# Patient Record
Sex: Female | Born: 1995 | Race: White | Hispanic: No | Marital: Single | Smoking: Former smoker
Health system: Southern US, Community
[De-identification: ages and names within clinical notes are randomized; demographics above are authoritative.]

## PROBLEM LIST (undated history)

## (undated) ENCOUNTER — Inpatient Hospital Stay (HOSPITAL_COMMUNITY): Payer: Self-pay

## (undated) DIAGNOSIS — A770 Spotted fever due to Rickettsia rickettsii: Secondary | ICD-10-CM

## (undated) DIAGNOSIS — N12 Tubulo-interstitial nephritis, not specified as acute or chronic: Secondary | ICD-10-CM

## (undated) DIAGNOSIS — J45909 Unspecified asthma, uncomplicated: Secondary | ICD-10-CM

## (undated) HISTORY — DX: Spotted fever due to Rickettsia rickettsii: A77.0

## (undated) HISTORY — PX: TYMPANOSTOMY TUBE PLACEMENT: SHX32

---
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
## 2014-01-16 ENCOUNTER — Encounter: Payer: Self-pay | Admitting: Obstetrics & Gynecology

## 2014-11-05 ENCOUNTER — Inpatient Hospital Stay (HOSPITAL_COMMUNITY)
Admission: AD | Admit: 2014-11-05 | Discharge: 2014-11-06 | Disposition: A | Discharge status: Home / Self Care | Payer: Medicaid Other | Source: Ambulatory Visit | Attending: Obstetrics and Gynecology | Admitting: Obstetrics and Gynecology

## 2014-11-05 DIAGNOSIS — Z87891 Personal history of nicotine dependence: Secondary | ICD-10-CM | POA: Insufficient documentation

## 2014-11-05 DIAGNOSIS — R109 Unspecified abdominal pain: Secondary | ICD-10-CM | POA: Insufficient documentation

## 2014-11-05 DIAGNOSIS — J45909 Unspecified asthma, uncomplicated: Secondary | ICD-10-CM | POA: Diagnosis not present

## 2014-11-05 DIAGNOSIS — O26899 Other specified pregnancy related conditions, unspecified trimester: Secondary | ICD-10-CM

## 2014-11-05 DIAGNOSIS — O26891 Other specified pregnancy related conditions, first trimester: Secondary | ICD-10-CM | POA: Diagnosis not present

## 2014-11-05 DIAGNOSIS — Z3A08 8 weeks gestation of pregnancy: Secondary | ICD-10-CM | POA: Diagnosis not present

## 2014-11-05 DIAGNOSIS — R1030 Lower abdominal pain, unspecified: Secondary | ICD-10-CM | POA: Diagnosis present

## 2014-11-05 DIAGNOSIS — O9989 Other specified diseases and conditions complicating pregnancy, childbirth and the puerperium: Secondary | ICD-10-CM | POA: Diagnosis not present

## 2014-11-05 LAB — POCT PREGNANCY, URINE: PREG TEST UR: POSITIVE — AB

## 2014-11-05 NOTE — MAU Note (Signed)
Having some lower left abdominal cramping that started tonight. Having SOB when doing every day activities, but is not SOB now. "feels like an asthma attack"  Rates 8/10. Denies vag bleeding but having a lot of discharge, but does not itch or have an odor. Had mirena placed, but "it fell out" 5-6 months ago. LMP:?

## 2014-11-06 ENCOUNTER — Inpatient Hospital Stay (HOSPITAL_COMMUNITY): Payer: Medicaid Other

## 2014-11-06 ENCOUNTER — Encounter (HOSPITAL_COMMUNITY): Payer: Self-pay

## 2014-11-06 DIAGNOSIS — R109 Unspecified abdominal pain: Secondary | ICD-10-CM | POA: Diagnosis not present

## 2014-11-06 DIAGNOSIS — O9989 Other specified diseases and conditions complicating pregnancy, childbirth and the puerperium: Secondary | ICD-10-CM

## 2014-11-06 LAB — GC/CHLAMYDIA PROBE AMP (~~LOC~~) NOT AT ARMC
CHLAMYDIA, DNA PROBE: NEGATIVE
NEISSERIA GONORRHEA: NEGATIVE

## 2014-11-06 LAB — WET PREP, GENITAL
TRICH WET PREP: NONE SEEN
YEAST WET PREP: NONE SEEN

## 2014-11-06 LAB — CBC
HEMATOCRIT: 32.6 % — AB (ref 36.0–46.0)
HEMOGLOBIN: 11.3 g/dL — AB (ref 12.0–15.0)
MCH: 29.9 pg (ref 26.0–34.0)
MCHC: 34.7 g/dL (ref 30.0–36.0)
MCV: 86.2 fL (ref 78.0–100.0)
Platelets: 238 10*3/uL (ref 150–400)
RBC: 3.78 MIL/uL — AB (ref 3.87–5.11)
RDW: 12.1 % (ref 11.5–15.5)
WBC: 5.9 10*3/uL (ref 4.0–10.5)

## 2014-11-06 LAB — URINALYSIS, ROUTINE W REFLEX MICROSCOPIC
Bilirubin Urine: NEGATIVE
Glucose, UA: NEGATIVE mg/dL
Hgb urine dipstick: NEGATIVE
Ketones, ur: NEGATIVE mg/dL
Leukocytes, UA: NEGATIVE
NITRITE: NEGATIVE
PH: 7 (ref 5.0–8.0)
Protein, ur: NEGATIVE mg/dL
SPECIFIC GRAVITY, URINE: 1.02 (ref 1.005–1.030)
UROBILINOGEN UA: 1 mg/dL (ref 0.0–1.0)

## 2014-11-06 LAB — ABO/RH: ABO/RH(D): AB POS

## 2014-11-06 LAB — HCG, QUANTITATIVE, PREGNANCY: HCG, BETA CHAIN, QUANT, S: 177833 m[IU]/mL — AB (ref <5)

## 2014-11-06 LAB — HIV ANTIBODY (ROUTINE TESTING W REFLEX): HIV Screen 4th Generation wRfx: NONREACTIVE

## 2014-11-06 NOTE — MAU Provider Note (Signed)
History     CSN: [id number]  Arrival date and time: 11/05/14 2321   First Provider Initiated Contact with Patient 11/05/14 2358      No chief complaint on file.  This is a 19 y.o. female at Unknown GA by unsure LMP who presents with + pregnancy test and lower abdominal pain which has worsened today.  Denies bleeding. States Mirena IUD fell out about 6 months ago and was not using other contraception. Has not had periods since it came out. Was seen by Dr Jolayne Panther in 2014 thinking it fell out, but it was in place then. This time, she actually had it in her hand  Had a vaginal delivery 04/30/12 at 38.6 weeks.  Normal delivery  With no complications except poor prenatal care compliance  Abdominal Pain This is a new problem. The current episode started today. The problem occurs constantly. The problem has been gradually worsening. The pain is located in the LLQ. The pain is at a severity of 8/10. The pain is moderate. The quality of the pain is aching and cramping. The abdominal pain does not radiate. Pertinent negatives include no anorexia, constipation, diarrhea, dysuria, fever, frequency, myalgias, nausea or vomiting. The pain is aggravated by palpation. The pain is relieved by nothing. She has tried nothing for the symptoms.   RN Note: Having some lower left abdominal cramping that started tonight. Having SOB when doing every day activities, but is not SOB now. "feels like an asthma attack" Rates 8/10. Denies vag bleeding but having a lot of discharge, but does not itch or have an odor. Had mirena placed, but "it fell out" 5-6 months ago. LMP:?           OB History    Gravida Para Term Preterm AB TAB SAB Ectopic Multiple Living   3 1 1  1  1   1       Past Medical History  Diagnosis Date  . RMSF [facility] spotted fever)     hospitalization  . Asthma   . Pyelonephritis     Past Surgical History  Procedure Laterality Date  . Tympanostomy tube placement      Family  History  Problem Relation Age of Onset  . Cancer Mother   . Hepatitis B Mother   . Diabetes Mother   . Cancer Maternal Grandmother     Social History  Substance Use Topics  . Smoking status: Former Games developer  . Smokeless tobacco: Former Neurosurgeon    Quit date: 03/30/2012  . Alcohol Use: No    Allergies:  Allergies  Allergen Reactions  . Fexofenadine Hcl     Headaches   . 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 Latex Rash    Prescriptions prior to admission  Medication Sig Dispense Refill Last Dose  . Prenatal Vit-Fe Fumarate-FA (PRENATAL MULTIVITAMIN) TABS Take 1 tablet by mouth daily.   Past Week at Unknown time  . CHLOROPHYLL PO Take 15 mLs by mouth 2 (two) times a week.   Not Taking  . ibuprofen (ADVIL,MOTRIN) 600 MG tablet Take 1 tablet (600 mg total) by mouth every 6 (six) hours. 50 tablet 1 More than a month at Unknown time  . ranitidine (ZANTAC) 150 MG tablet Take 150 mg by mouth 2 (two) times daily.   Taking   Medical, Surgical, Family and Social histories reviewed and are listed above.  Medications and allergies reviewed.  I reviewed her notes from her 2014 medical exams.  Review of Systems  Constitutional: Negative for fever, chills and malaise/fatigue.  Respiratory: Positive for shortness of breath (Had some this week but none now, states it is just feeling out of breath). Negative for cough, hemoptysis, sputum production and wheezing (No respiratory complaints at this time).   Gastrointestinal: Positive for abdominal pain. Negative for nausea, vomiting, diarrhea, constipation and anorexia.  Genitourinary: Negative for dysuria and frequency.  Musculoskeletal: Negative for myalgias and back pain.  Neurological: Negative for dizziness, focal weakness and weakness.  Other systems negative  Physical Exam   Blood pressure 110/67, pulse 86, temperature 98.2 F (36.8 C), temperature source Oral, resp. rate 18, height 5\' 2"  (1.575 m), weight 133 lb 9.6 oz (60.601  kg), SpO2 100 %, currently breastfeeding.  Physical Exam  Constitutional: She is oriented to person, place, and time. She appears well-developed and well-nourished. No distress.  HENT:  Head: Normocephalic.  Cardiovascular: Normal rate and regular rhythm.   Respiratory: Effort normal. No respiratory distress.  GI: Soft.  Genitourinary: Vagina normal. No vaginal discharge found.  Cervix closed and long, no abnormal discharge Cultures obtained Wet prep obtained Uterus is tender, about 6-8 wk size Is tender in bilateral adnexae  Musculoskeletal: Normal range of motion. She exhibits no edema or tenderness.  Neurological: She is alert and oriented to person, place, and time.  Skin: Skin is warm and dry.  Psychiatric: She has a normal mood and affect.    MAU Course  Procedures  MDM This pain could represent a normal pregnancy with cramping, spontaneous abortion or even an ectopic which can be life-threatening.  The tenderness she exhibited on exam is worriesome for ectopic pregnancy. Cultures were done to rule out pelvic infection Blood drawn for Quant HCG, CBC, ABO/Rh  Results for orders placed or performed during the hospital encounter of 11/05/14 (from the past 24 hour(s))  Urinalysis, Routine w reflex microscopic (not at [facility])     Status: None   Collection Time: 11/05/14 11:38 PM  Result Value Ref Range   Color, Urine YELLOW YELLOW   APPearance CLEAR CLEAR   Specific Gravity, Urine 1.020 1.005 - 1.030   pH 7.0 5.0 - 8.0   Glucose, UA NEGATIVE NEGATIVE mg/dL   Hgb urine dipstick NEGATIVE NEGATIVE   Bilirubin Urine NEGATIVE NEGATIVE   Ketones, ur NEGATIVE NEGATIVE mg/dL   Protein, ur NEGATIVE NEGATIVE mg/dL   Urobilinogen, UA 1.0 0.0 - 1.0 mg/dL   Nitrite NEGATIVE NEGATIVE   Leukocytes, UA NEGATIVE NEGATIVE  Pregnancy, urine POC     Status: Abnormal   Collection Time: 11/05/14 11:46 PM  Result Value Ref Range   Preg Test, Ur POSITIVE (A) NEGATIVE  hCG, quantitative,  pregnancy     Status: Abnormal   Collection Time: 11/05/14 11:59 PM  Result Value Ref Range   hCG, Beta Chain, Sharene Butters, S 161096 (H) <5 mIU/mL  CBC     Status: Abnormal   Collection Time: 11/05/14 11:59 PM  Result Value Ref Range   WBC 5.9 4.0 - 10.5 K/uL   RBC 3.78 (L) 3.87 - 5.11 MIL/uL   Hemoglobin 11.3 (L) 12.0 - 15.0 g/dL   HCT 04.5 (L) 40.9 - 81.1 %   MCV 86.2 78.0 - 100.0 fL   MCH 29.9 26.0 - 34.0 pg   MCHC 34.7 30.0 - 36.0 g/dL   RDW 91.4 78.2 - 95.6 %   Platelets 238 150 - 400 K/uL  ABO/Rh     Status: None   Collection Time: 11/05/14 11:59 PM  Result Value Ref Range   ABO/RH(D) AB POS   Wet prep, genital     Status: Abnormal   Collection Time: 11/06/14 12:16 AM  Result Value Ref Range   Yeast Wet Prep HPF POC NONE SEEN NONE SEEN   Trich, Wet Prep NONE SEEN NONE SEEN   Clue Cells Wet Prep HPF POC FEW (A) NONE SEEN   WBC, Wet Prep HPF POC MANY (A) NONE SEEN   US Ob Comp Less 14 Wks  11/06/2014   CLINICAL DATA:  Pregnant patient with pelvic pain and bilateral adnexal tenderness. Cramping for 1 day.  EXAM: OBSTETRIC <14 WK [place] AND TRANSVAGINAL OB US  TECHNIQUE: Both transabdominal and transvaginal ultrasound examinations were performed for complete evaluation of the gestation as well as the maternal uterus, adnexal regions, and pelvic cul-de-sac. Transvaginal technique was performed to assess early pregnancy.  COMPARISON:  None.  FINDINGS: Intrauterine gestational sac: Visualized/normal in shape.  Yolk sac:  Present.  Embryo:  Present.  Cardiac Activity: Present.  Heart Rate: 158  bpm  CRL:  18  mm   8 w   2 d                  [place] EDC: 06/16/2015  Maternal uterus/adnexae: Small subchorionic hemorrhage fundal and questionable lower. The left ovary measures 2.7 x 1.3 x 2.3 cm and is normal. The right ovary measures 3.7 x 2.6 x 2.6 cm with a probable corpus luteal cyst. There is blood flow to both ovaries. There is no pelvic free fluid.  IMPRESSION: Single live intrauterine pregnancy  estimated gestational age [redacted] weeks 2 days for estimated date of delivery 06/16/2015. Small subchorionic hemorrhage.   Electronically Signed   By: Rubye Oaks M.D.   On: 11/06/2014 01:15   US Ob Transvaginal  11/06/2014   CLINICAL DATA:  Pregnant patient with pelvic pain and bilateral adnexal tenderness. Cramping for 1 day.  EXAM: OBSTETRIC <14 WK [place] AND TRANSVAGINAL OB US  TECHNIQUE: Both transabdominal and transvaginal ultrasound examinations were performed for complete evaluation of the gestation as well as the maternal uterus, adnexal regions, and pelvic cul-de-sac. Transvaginal technique was performed to assess early pregnancy.  COMPARISON:  None.  FINDINGS: Intrauterine gestational sac: Visualized/normal in shape.  Yolk sac:  Present.  Embryo:  Present.  Cardiac Activity: Present.  Heart Rate: 158  bpm  CRL:  18  mm   8 w   2 d                  [place] EDC: 06/16/2015  Maternal uterus/adnexae: Small subchorionic hemorrhage fundal and questionable lower. The left ovary measures 2.7 x 1.3 x 2.3 cm and is normal. The right ovary measures 3.7 x 2.6 x 2.6 cm with a probable corpus luteal cyst. There is blood flow to both ovaries. There is no pelvic free fluid.  IMPRESSION: Single live intrauterine pregnancy estimated gestational age [redacted] weeks 2 days for estimated date of delivery 06/16/2015. Small subchorionic hemorrhage.   Electronically Signed   By: Rubye Oaks M.D.   On: 11/06/2014 01:15    Assessment and Plan  A:  Single IUP at [redacted]w[redacted]d by [place]       No evidence of ectopic pregnancy       Small subchorionic hemorrhage with no vaginal bleeding  P:  Discussed findings and results with patient       Pictures given       Reassured       Encouraged early  prenatal care for OB exams, list of providers given       Come back if pain increases or bleeding develops. Discussed she might see some spotting due to [facility] 11/06/2014, 12:18 AM

## 2014-11-06 NOTE — Discharge Instructions (Signed)
Abdominal Pain During Pregnancy °Abdominal pain is common in pregnancy. Most of the time, it does not cause harm. There are many causes of abdominal pain. Some causes are more serious than others. Some of the causes of abdominal pain in pregnancy are easily diagnosed. Occasionally, the diagnosis takes time to understand. Other times, the cause is not determined. Abdominal pain can be a sign that something is very wrong with the pregnancy, or the pain may have nothing to do with the pregnancy at all. For this reason, always tell your health care provider if you have any abdominal discomfort. °HOME CARE INSTRUCTIONS  °Monitor your abdominal pain for any changes. The following actions may help to alleviate any discomfort you are experiencing: °· Do not have sexual intercourse or put anything in your vagina until your symptoms go away completely. °· Get plenty of rest until your pain improves. °· Drink clear fluids if you feel nauseous. Avoid solid food as long as you are uncomfortable or nauseous. °· Only take over-the-counter or prescription medicine as directed by your health care provider. °· Keep all follow-up appointments with your health care provider. °SEEK IMMEDIATE MEDICAL CARE IF: °· You are bleeding, leaking fluid, or passing tissue from the vagina. °· You have increasing pain or cramping. °· You have persistent vomiting. °· You have painful or bloody urination. °· You have a fever. °· You notice a decrease in your baby's movements. °· You have extreme weakness or feel faint. °· You have shortness of breath, with or without abdominal pain. °· You develop a severe headache with abdominal pain. °· You have abnormal vaginal discharge with abdominal pain. °· You have persistent diarrhea. °· You have abdominal pain that continues even after rest, or gets worse. °MAKE SURE YOU:  °· Understand these instructions. °· Will watch your condition. °· Will get help right away if you are not doing well or get  worse. °Document Released: 03/03/2005 Document Revised: 12/22/2012 Document Reviewed: 09/30/2012 °ExitCare® Patient Information ©2015 [employer]. This information is not intended to replace advice given to you by your health care provider. Make sure you discuss any questions you have with your health care provider. ° °Prenatal Care Providers °[facility]    [facility] ° Phone- [phone]     Phone: [phone] °         °[facility]                      [facility] ° @[place]     Phone: [phone] ° Phone: [phone] °        [facility] °[facility]     Phone: [phone] ° Phone: [phone]   °        [facility] °[facility] [place]                hone: [phone] ° Phone: [phone] °        [facility] °Dr. Bernard Marshall      Phone: [phone] ° Phone: [phone] °        [facility] °[facility]                Phone: [phone] ° Women’s Health  ° Phone:[phone]    [facility]) °         Phone: [phone] °[facility] °  Phone: [phone] ° °

## 2015-05-22 ENCOUNTER — Encounter (HOSPITAL_COMMUNITY): Payer: Self-pay | Admitting: *Deleted

## 2015-05-22 ENCOUNTER — Inpatient Hospital Stay (HOSPITAL_COMMUNITY): Payer: Medicaid Other

## 2015-05-22 ENCOUNTER — Inpatient Hospital Stay (HOSPITAL_COMMUNITY)
Admission: AD | Admit: 2015-05-22 | Discharge: 2015-05-22 | Disposition: A | Discharge status: Home / Self Care | Payer: Medicaid Other | Source: Ambulatory Visit | Attending: Family Medicine | Admitting: Family Medicine

## 2015-05-22 DIAGNOSIS — J45909 Unspecified asthma, uncomplicated: Secondary | ICD-10-CM | POA: Insufficient documentation

## 2015-05-22 DIAGNOSIS — D649 Anemia, unspecified: Secondary | ICD-10-CM | POA: Diagnosis not present

## 2015-05-22 DIAGNOSIS — O9989 Other specified diseases and conditions complicating pregnancy, childbirth and the puerperium: Secondary | ICD-10-CM | POA: Diagnosis not present

## 2015-05-22 DIAGNOSIS — R109 Unspecified abdominal pain: Secondary | ICD-10-CM | POA: Insufficient documentation

## 2015-05-22 DIAGNOSIS — O479 False labor, unspecified: Secondary | ICD-10-CM | POA: Diagnosis present

## 2015-05-22 DIAGNOSIS — Z3A36 36 weeks gestation of pregnancy: Secondary | ICD-10-CM | POA: Insufficient documentation

## 2015-05-22 DIAGNOSIS — R042 Hemoptysis: Secondary | ICD-10-CM | POA: Insufficient documentation

## 2015-05-22 DIAGNOSIS — Z87891 Personal history of nicotine dependence: Secondary | ICD-10-CM | POA: Diagnosis not present

## 2015-05-22 DIAGNOSIS — O26893 Other specified pregnancy related conditions, third trimester: Secondary | ICD-10-CM | POA: Insufficient documentation

## 2015-05-22 DIAGNOSIS — Z789 Other specified health status: Secondary | ICD-10-CM

## 2015-05-22 DIAGNOSIS — O99513 Diseases of the respiratory system complicating pregnancy, third trimester: Secondary | ICD-10-CM | POA: Insufficient documentation

## 2015-05-22 DIAGNOSIS — O99013 Anemia complicating pregnancy, third trimester: Secondary | ICD-10-CM | POA: Insufficient documentation

## 2015-05-22 DIAGNOSIS — O4703 False labor before 37 completed weeks of gestation, third trimester: Secondary | ICD-10-CM

## 2015-05-22 LAB — DIFFERENTIAL
BASOS PCT: 0 %
Basophils Absolute: 0 10*3/uL (ref 0.0–0.1)
EOS PCT: 0 %
Eosinophils Absolute: 0 10*3/uL (ref 0.0–0.7)
LYMPHS PCT: 18 %
Lymphs Abs: 1.3 10*3/uL (ref 0.7–4.0)
MONO ABS: 0.4 10*3/uL (ref 0.1–1.0)
Monocytes Relative: 5 %
NEUTROS PCT: 77 %
Neutro Abs: 5.6 10*3/uL (ref 1.7–7.7)

## 2015-05-22 LAB — CBC
HCT: 27.7 % — ABNORMAL LOW (ref 36.0–46.0)
Hemoglobin: 9.1 g/dL — ABNORMAL LOW (ref 12.0–15.0)
MCH: 27.7 pg (ref 26.0–34.0)
MCHC: 32.9 g/dL (ref 30.0–36.0)
MCV: 84.2 fL (ref 78.0–100.0)
PLATELETS: 272 10*3/uL (ref 150–400)
RBC: 3.29 MIL/uL — AB (ref 3.87–5.11)
RDW: 12.8 % (ref 11.5–15.5)
WBC: 7.3 10*3/uL (ref 4.0–10.5)

## 2015-05-22 LAB — URINALYSIS, ROUTINE W REFLEX MICROSCOPIC
BILIRUBIN URINE: NEGATIVE
Glucose, UA: NEGATIVE mg/dL
HGB URINE DIPSTICK: NEGATIVE
KETONES UR: NEGATIVE mg/dL
NITRITE: NEGATIVE
PROTEIN: NEGATIVE mg/dL
SPECIFIC GRAVITY, URINE: 1.015 (ref 1.005–1.030)
pH: 7.5 (ref 5.0–8.0)

## 2015-05-22 LAB — APTT: APTT: 27 s (ref 24–37)

## 2015-05-22 LAB — RAPID URINE DRUG SCREEN, HOSP PERFORMED
AMPHETAMINES: NOT DETECTED
BENZODIAZEPINES: NOT DETECTED
Barbiturates: NOT DETECTED
COCAINE: NOT DETECTED
OPIATES: NOT DETECTED
Tetrahydrocannabinol: NOT DETECTED

## 2015-05-22 LAB — URINE MICROSCOPIC-ADD ON
BACTERIA UA: NONE SEEN
RBC / HPF: NONE SEEN RBC/hpf (ref 0–5)

## 2015-05-22 LAB — HEPATITIS B SURFACE ANTIGEN: Hepatitis B Surface Ag: NEGATIVE

## 2015-05-22 MED ORDER — FERROUS SULFATE 325 (65 FE) MG PO TABS: 325.0000 mg | ORAL_TABLET | Freq: Two times a day (BID) | ORAL | Status: DC

## 2015-05-22 NOTE — MAU Note (Signed)
Urine sent to Lab

## 2015-05-22 NOTE — H&P (Signed)
MAU HISTORY AND PHYSICAL  Chief Complaint:  Abdominal Pain and Hemoptysis   Maria Booker is a 20 y.o.  G3P1011 with IUP at 3578w3d presenting for Abdominal Pain and Hemoptysis . Patient states she has been having regular contractions Q3 minutes that started around 0100 today, none vaginal bleeding, intact membranes, with active fetal movement.  The patient states that contractions are causing unusual pelvic pain that is different than braxton hixs.  Patient denies RUQ pain, HA, or vision change.  The patient has also had hemoptysis that started this AM.  The patient denies dyspnea, fever, CP, night sweats, or weight loss.  She has had sick contacts but denies systemic symptoms including N, V, D, chills, aches, rhinorrhea, sinus cogestion, dysuria, lumbar back pain.    Past obstetrical history  1st pregnancy- 1st term spontaneous abortion; no medical intervention. 2nd pregnancy- SVD at 39 weeks w/o complications.  Received prenatal care.  Past Medical History  Diagnosis Date  . RMSF [facility]([place] spotted fever)     hospitalization  . Asthma   . Pyelonephritis     Past Surgical History  Procedure Laterality Date  . Tympanostomy tube placement      Family History  Problem Relation Age of Onset  . Cancer Mother   . Hepatitis B Mother   . Diabetes Mother   . Cancer Maternal Grandmother     Social History  Substance Use Topics  . Smoking status: Former Games developermoker  . Smokeless tobacco: Former NeurosurgeonUser    Quit date: 03/30/2012  . Alcohol Use: No    Allergies  Allergen Reactions  . Fexofenadine Hcl     Headaches   . 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 Latex Rash    Prescriptions prior to admission  Medication Sig Dispense Refill Last Dose  . acetaminophen (TYLENOL) 325 MG tablet Take 325 mg by mouth every 6 (six) hours as needed for mild pain or headache.   05/21/2015 at Unknown time  . Hyprom-Naphaz-Polysorb-Zn Sulf (CLEAR EYES COMPLETE OP) Apply 1 drop to eye  daily as needed (for eye irritation).   05/22/2015 at Unknown time  . Prenatal Vit-Fe Fumarate-FA (PRENATAL MULTIVITAMIN) TABS Take 1 tablet by mouth daily.   05/21/2015 at Unknown time  . [DISCONTINUED] ibuprofen (ADVIL,MOTRIN) 600 MG tablet Take 1 tablet (600 mg total) by mouth every 6 (six) hours. 50 tablet 1 More than a month at Unknown time    Review of Systems - Negative except for what is mentioned in HPI.  Physical Exam  Blood pressure 113/72, pulse 112, temperature 98.3 F (36.8 C), temperature source Oral, resp. rate 18, SpO2 97 %, currently breastfeeding. GENERAL: Well-developed, well-nourished female in no acute distress.  LUNGS: Clear to auscultation bilaterally.  HEART: Regular rate and rhythm. ABDOMEN: Soft, nontender, nondistended, gravid.  EXTREMITIES: Nontender, no edema, 2+ distal pulses. Cervical Exam: 1.5/soft/-3 FHT:  Category 1 tracing, 135 bpm, mod. Variability, accels +, and no deccels. Contractions: Irregular   Labs: No results found for this or any previous visit (from the past 24 hour(s)).  Imaging Studies:  No results found.  Assessment: Maria Booker is  20 y.o. G3P1011 at 7578w3d presents with Abdominal Pain and Hemoptysis .  Plan:   #Labor Check Although patient has cervical change of 1.5 cm, she is multiparous.  Will continue to monitor to r/o labor.  The patient is presenting w/o prenatal care and a uncomplicated past obstetrical history per patient.   -Prenatal Labs: type and  screen, CBC, RPR, HIV ab,  -Will r/o UTI w/ UA and reflex Ucx -UDS -GBS  #Hemoptysis The likely diagnosis is bronchitis, but will look to exclude other causes by CXR.  There is low suspicion for infection as patient is afebrile and does not have PE findings of pneumonia or tuberculosis.  Also low suspicion for PE.    Nolon Bussing [place] 3/7/201712:23 PM   OB FELLOW MAU DISCHARGE ATTESTATION  I have seen and examined this patient; I agree with above documentation in the  resident's note. Here for labor eval but neither contracting painfully nor frequently and cervix 1.5 cm on exam with reactive NST and no signs/symptoms abruption or pprom. Appropriate return precautions given. Reports small amount hemoptysis today associated with uri vs allergies. Suspect that to be the etiology. Platelets wnl and normal coags, CXR wnl, no signs dvt and no hypoxia or tachypnea to suggest PE, no close contacts w/ PE and normal CXR w/o chronic cough or weight loss so TB also very unlikely. Could very well have been nose bleed that patient confused for hemoptysis. Appropriate return precautions discussed. Anemic moderately so, ferrous sulfate ordered. Prenatal labs ordered. Outpatient u/s ordered. Referred to our clinic to establish care asap. GBS culture collected.  Silvano Bilis, MD 2:05 PM

## 2015-05-22 NOTE — MAU Note (Addendum)
Pt C/O lower abd cramping that started this morning, has also been coughing up blood.  R eye is red, wondering if she has pink eye.  Limited PNC - last prenatal care visit was @ 14 weeks @ [facility] [place]

## 2015-05-22 NOTE — Discharge Instructions (Signed)
Please keep upcoming appointments.  Call clinic with any further questions/concerns.

## 2015-05-23 ENCOUNTER — Telehealth: Payer: Self-pay | Admitting: Obstetrics & Gynecology

## 2015-05-23 DIAGNOSIS — Z789 Other specified health status: Secondary | ICD-10-CM

## 2015-05-23 LAB — HEMOGLOBIN A1C
HEMOGLOBIN A1C: 5.2 % (ref 4.8–5.6)
MEAN PLASMA GLUCOSE: 103 mg/dL

## 2015-05-23 LAB — CULTURE, OB URINE: CULTURE: NO GROWTH

## 2015-05-23 LAB — RUBELLA SCREEN: Rubella: <0.9 index — ABNORMAL LOW (ref >0.99)

## 2015-05-23 LAB — RPR: RPR: NONREACTIVE

## 2015-05-23 NOTE — Telephone Encounter (Signed)
Patient needs an US appointment.

## 2015-05-24 LAB — CULTURE, BETA STREP (GROUP B ONLY)

## 2015-05-25 ENCOUNTER — Encounter: Payer: Medicaid Other | Admitting: Obstetrics & Gynecology

## 2015-05-28 ENCOUNTER — Encounter: Payer: Medicaid Other | Admitting: Obstetrics & Gynecology

## 2015-05-28 ENCOUNTER — Encounter: Payer: Self-pay | Admitting: Family Medicine

## 2015-05-28 ENCOUNTER — Telehealth: Payer: Self-pay | Admitting: Family Medicine

## 2015-05-28 NOTE — Telephone Encounter (Signed)
Left message for patient to return call to clinic. Patient missed her new ob appointment, mailing certified letter.

## 2015-06-14 ENCOUNTER — Inpatient Hospital Stay (HOSPITAL_COMMUNITY)
Admission: AD | Admit: 2015-06-14 | Discharge: 2015-06-15 | DRG: 776 | Disposition: A | Discharge status: Home / Self Care | Payer: Medicaid Other | Source: Ambulatory Visit | Attending: Obstetrics & Gynecology | Admitting: Obstetrics & Gynecology

## 2015-06-14 ENCOUNTER — Encounter (HOSPITAL_COMMUNITY): Payer: Self-pay

## 2015-06-14 DIAGNOSIS — Z87891 Personal history of nicotine dependence: Secondary | ICD-10-CM | POA: Diagnosis not present

## 2015-06-14 DIAGNOSIS — IMO0001 Reserved for inherently not codable concepts without codable children: Secondary | ICD-10-CM

## 2015-06-14 DIAGNOSIS — Z833 Family history of diabetes mellitus: Secondary | ICD-10-CM | POA: Diagnosis not present

## 2015-06-14 NOTE — MAU Note (Signed)
Neonatologist notified of pt arrival in MAU and requested that baby be evaluated. Will come see the baby

## 2015-06-14 NOTE — MAU Provider Note (Signed)
Chief Complaint:  Delivered    First Provider Initiated Contact with Patient 06/14/15 2052      Maria Booker is a 20 y.o. G3P1011 at 18w5dwho presents to maternity admissions reporting vaginal delivery at home about 4 hours ago.  States labor went very fast and progressed to delivery after only 3 hours of labor.   States delivered in 3 pushes.  States they waited an hour and the placenta came out.  Denies heavy bleeding.  States she kept baby skin to skin.    Had no prenatal care. Was seen by me in August for abdominal pain and was found to be 8.[redacted] wks pregnant at that time. States went to the [facility] in [place] and told them she had intermittent use of a car. States they told her that if she "could not come regularly, she should not bother coming at all".  She was seen here on 05/22/15 for bronchitis. They did do prenatal labs at that time. These labs were normal except for rubella non-immune.  States "we do not vaccinate" and refuses vaccine.   Group B Strep culture was Negative.  States baby is a female   Gynecologic Exam The patient's primary symptoms include pelvic pain (post delivery cramps) and vaginal bleeding. The patient's pertinent negatives include no genital itching or genital rash. This is a new problem. The current episode started today. The problem has been unchanged. The pain is mild. She is not pregnant. Pertinent negatives include no back pain, chills, constipation, dysuria, fever or vomiting. The vaginal discharge was bloody. The vaginal bleeding is lighter than menses. She has not been passing clots. She has not been passing tissue. It is unknown whether or not her partner has an STD. Menstrual history:  Post Delivery.    Past Medical History: Past Medical History  Diagnosis Date  . RMSF [facility] spotted fever)     hospitalization  . Asthma   . Pyelonephritis     Past obstetric history: OB History  Gravida Para Term Preterm AB SAB TAB Ectopic Multiple Living   # Outcome Date GA Lbr Len/2nd Weight Sex Delivery Anes PTL Lv  3 Current           2 Term 04/30/12 [redacted]w[redacted]d 08:12 / 00:11 6 lb 13.7 oz (3.11 kg) M Vag-Spont EPI  Y     Comments: wdl  1 SAB 2011              Past Surgical History: Past Surgical History  Procedure Laterality Date  . Tympanostomy tube placement      Family History: Family History  Problem Relation Age of Onset  . Cancer Mother   . Hepatitis B Mother   . Diabetes Mother   . Cancer Maternal Grandmother     Social History: Social History  Substance Use Topics  . Smoking status: Former Games developer  . Smokeless tobacco: Former Neurosurgeon    Quit date: 03/30/2012  . Alcohol Use: No    Allergies:  Allergies  Allergen Reactions  . Fexofenadine Hcl     Headaches   . 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 Latex Rash    Meds:  Prescriptions prior to admission  Medication Sig Dispense Refill Last Dose  . acetaminophen (TYLENOL) 325 MG tablet Take 325 mg by mouth every 6 (six) hours as needed for mild pain or headache.   05/21/2015 at Unknown time  .  ferrous sulfate (FERROUSUL) 325 (65 FE) MG tablet Take 1 tablet (325 mg total) by mouth 2 (two) times daily with a meal. 60 tablet 3   . Hyprom-Naphaz-Polysorb-Zn Sulf (CLEAR EYES COMPLETE OP) Apply 1 drop to eye daily as needed (for eye irritation).   05/22/2015 at Unknown time  . Prenatal Vit-Fe Fumarate-FA (PRENATAL MULTIVITAMIN) TABS Take 1 tablet by mouth daily.   05/21/2015 at Unknown time    I have reviewed patient's Past Medical Hx, Surgical Hx, Family Hx, Social Hx, medications and allergies.   ROS:  Review of Systems  Constitutional: Negative for fever and chills.  Gastrointestinal: Negative for vomiting and constipation.  Genitourinary: Positive for pelvic pain (post delivery cramps). Negative for dysuria.  Musculoskeletal: Negative for back pain.   Other systems negative  Physical Exam  Patient Vitals for the past 24 hrs:  BP Temp  Temp src Pulse Resp  06/14/15 2050 129/69 mmHg 97.8 F (36.6 C) Oral 119 18   Constitutional: Well-developed, well-nourished female in no acute distress.  Cardiovascular: normal rate and rhythm Respiratory: normal effort, clear to auscultation bilaterally GI: Abd soft, non-tender, gravid appropriate for gestational age.   No rebound or guarding.   Fundus firm at Umbilicus MS: Extremities nontender, no edema, normal ROM Neurologic: Alert and oriented x 4.  GU: Neg CVAT.  PELVIC EXAM: Perineum intact.  Small right labial split noted, not deep, not bleeding, not repaired                           Lochia small   Labs: No results found for this or any previous visit (from the past 24 hour(s)). --/--/AB POS (08/21 2359)  Imaging:  Dg Chest 2 View  05/22/2015  CLINICAL DATA:  Hemoptysis. EXAM: CHEST  2 VIEW COMPARISON:  None. FINDINGS: The heart size and mediastinal contours are within normal limits. Both lungs are clear. The visualized skeletal structures are unremarkable. IMPRESSION: No active cardiopulmonary disease. Electronically Signed   By: Signa Kellaylor  Stroud M.D.   On: 05/22/2015 12:53    MAU Course/MDM: Patient presents stating she had a baby 4 hours ago.  States she waited so long because they were waiting on the placenta. States delivery was uneventful with no difficulty.  Delivered in 3 pushes.  Baby is currently pink, breastfeeding.  Good tone and color.  Assessment: SIUP at 10473w5d  Status post vaginal delivery outside hospital GBS was negative at last visit  Plan: Admit to postpartum Patient has agreed to admission for her and baby. Neonatology called to examine baby Cord long and tied with yarn, clamped and cut by RN Consult to social work Baby weighed 8lb3oz     Wynelle BourgeoisMarie Williams CNM, MSN Certified Nurse-Midwife 06/14/2015 9:20 PM   [facility] Hogan CNM while still waiting for Peds to examine baby. Aviva SignsMarie L Williams, CNM

## 2015-06-14 NOTE — H&P (Signed)
Chief Complaint:  Delivered    First Provider Initiated Contact with Patient 06/14/15 2052      Maria Booker is a 20 y.o. G3P1011 at 72w5dwho presents to maternity admissions reporting vaginal delivery at home about 4 hours ago.  States labor went very fast and progressed to delivery after only 3 hours of labor.   States delivered in 3 pushes.  States they waited an hour and the placenta came out.  Denies heavy bleeding.  States she kept baby skin to skin.    Had no prenatal care. Was seen by me in August for abdominal pain and was found to be 8.[redacted] wks pregnant at that time. States went to the [facility] in [place] and told them she had intermittent use of a car. States they told her that if she "could not come regularly, she should not bother coming at all".  She was seen here on 05/22/15 for bronchitis. They did do prenatal labs at that time. These labs were normal except for rubella non-immune.  States "we do not vaccinate" and refuses vaccine.   Group B Strep culture was Negative.  States baby is a female   Gynecologic Exam The patient's primary symptoms include pelvic pain (post delivery cramps) and vaginal bleeding. The patient's pertinent negatives include no genital itching or genital rash. This is a new problem. The current episode started today. The problem has been unchanged. The pain is mild. She is not pregnant. Pertinent negatives include no back pain, chills, constipation, dysuria, fever or vomiting. The vaginal discharge was bloody. The vaginal bleeding is lighter than menses. She has not been passing clots. She has not been passing tissue. It is unknown whether or not her partner has an STD. Menstrual history:  Post Delivery.    Past Medical History: Past Medical History  Diagnosis Date  . RMSF [facility] spotted fever)     hospitalization  . Asthma   . Pyelonephritis     Past obstetric history: OB History  Gravida Para Term Preterm AB SAB TAB Ectopic Multiple Living   # Outcome Date GA Lbr Len/2nd Weight Sex Delivery Anes PTL Lv  3 Current           2 Term 04/30/12 [redacted]w[redacted]d 08:12 / 00:11 6 lb 13.7 oz (3.11 kg) M Vag-Spont EPI  Y     Comments: wdl  1 SAB 2011              Past Surgical History: Past Surgical History  Procedure Laterality Date  . Tympanostomy tube placement      Family History: Family History  Problem Relation Age of Onset  . Cancer Mother   . Hepatitis B Mother   . Diabetes Mother   . Cancer Maternal Grandmother     Social History: Social History  Substance Use Topics  . Smoking status: Former Games developer  . Smokeless tobacco: Former Neurosurgeon    Quit date: 03/30/2012  . Alcohol Use: No    Allergies:  Allergies  Allergen Reactions  . Fexofenadine Hcl     Headaches   . 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 Latex Rash    Meds:  Prescriptions prior to admission  Medication Sig Dispense Refill Last Dose  . acetaminophen (TYLENOL) 325 MG tablet Take 325 mg by mouth every 6 (six) hours as needed for mild pain or headache.   05/21/2015 at Unknown time  .  ferrous sulfate (FERROUSUL) 325 (65 FE) MG tablet Take 1 tablet (325 mg total) by mouth 2 (two) times daily with a meal. 60 tablet 3   . Hyprom-Naphaz-Polysorb-Zn Sulf (CLEAR EYES COMPLETE OP) Apply 1 drop to eye daily as needed (for eye irritation).   05/22/2015 at Unknown time  . Prenatal Vit-Fe Fumarate-FA (PRENATAL MULTIVITAMIN) TABS Take 1 tablet by mouth daily.   05/21/2015 at Unknown time    I have reviewed patient's Past Medical Hx, Surgical Hx, Family Hx, Social Hx, medications and allergies.   ROS:  Review of Systems  Constitutional: Negative for fever and chills.  Gastrointestinal: Negative for vomiting and constipation.  Genitourinary: Positive for pelvic pain (post delivery cramps). Negative for dysuria.  Musculoskeletal: Negative for back pain.   Other systems negative  Physical Exam  Patient Vitals for the past 24 hrs:  BP Temp  Temp src Pulse Resp  06/14/15 2050 129/69 mmHg 97.8 F (36.6 C) Oral 119 18   Constitutional: Well-developed, well-nourished female in no acute distress.  Cardiovascular: normal rate and rhythm Respiratory: normal effort, clear to auscultation bilaterally GI: Abd soft, non-tender, gravid appropriate for gestational age.   No rebound or guarding.   Fundus firm at Umbilicus MS: Extremities nontender, no edema, normal ROM Neurologic: Alert and oriented x 4.  GU: Neg CVAT.  PELVIC EXAM: Perineum intact.  Small right labial split noted, not deep, not bleeding, not repaired                           Lochia small   Labs: No results found for this or any previous visit (from the past 24 hour(s)). --/--/AB POS (08/21 2359)  Imaging:  Dg Chest 2 View  05/22/2015  CLINICAL DATA:  Hemoptysis. EXAM: CHEST  2 VIEW COMPARISON:  None. FINDINGS: The heart size and mediastinal contours are within normal limits. Both lungs are clear. The visualized skeletal structures are unremarkable. IMPRESSION: No active cardiopulmonary disease. Electronically Signed   By: Signa Kellaylor  Stroud M.D.   On: 05/22/2015 12:53    MAU Course/MDM: Patient presents stating she had a baby 4 hours ago.  States she waited so long because they were waiting on the placenta. States delivery was uneventful with no difficulty.  Delivered in 3 pushes.  Baby is currently pink, breastfeeding.  Good tone and color.  Assessment: SIUP at 5871w5d  Status post vaginal delivery outside hospital GBS was negative at last visit  Plan: Admit to postpartum Patient has agreed to admission for her and baby. Neonatology called to examine baby Cord long and tied with yarn, clamped and cut by RN Consult to social work Baby weighed 8lb3oz

## 2015-06-14 NOTE — MAU Note (Signed)
Pt presents stating she delivered at home at 1649 today. No PNC. States no problems with delivery and vaginal bleeding is minimal. Pt states she feels fine but wanted to be checked just in case

## 2015-06-15 DIAGNOSIS — Z833 Family history of diabetes mellitus: Secondary | ICD-10-CM | POA: Diagnosis not present

## 2015-06-15 DIAGNOSIS — Z87891 Personal history of nicotine dependence: Secondary | ICD-10-CM | POA: Diagnosis not present

## 2015-06-15 LAB — TYPE AND SCREEN
ABO/RH(D): AB POS
ANTIBODY SCREEN: NEGATIVE

## 2015-06-15 LAB — RAPID HIV SCREEN (HIV 1/2 AB+AG)
HIV 1/2 Antibodies: NONREACTIVE
HIV-1 P24 Antigen - HIV24: NONREACTIVE

## 2015-06-15 LAB — CBC
HCT: 26.7 % — ABNORMAL LOW (ref 36.0–46.0)
HEMOGLOBIN: 8.7 g/dL — AB (ref 12.0–15.0)
MCH: 26.7 pg (ref 26.0–34.0)
MCHC: 32.6 g/dL (ref 30.0–36.0)
MCV: 81.9 fL (ref 78.0–100.0)
Platelets: 305 10*3/uL (ref 150–400)
RBC: 3.26 MIL/uL — AB (ref 3.87–5.11)
RDW: 13.9 % (ref 11.5–15.5)
WBC: 11 10*3/uL — ABNORMAL HIGH (ref 4.0–10.5)

## 2015-06-15 MED ORDER — ONDANSETRON HCL 4 MG PO TABS: 4.0000 mg | ORAL_TABLET | ORAL | Status: DC | PRN

## 2015-06-15 MED ORDER — IBUPROFEN 600 MG PO TABS: 600.0000 mg | ORAL_TABLET | Freq: Four times a day (QID) | ORAL | Status: DC

## 2015-06-15 MED ORDER — PRENATAL MULTIVITAMIN CH
1.0000 | ORAL_TABLET | Freq: Every day | ORAL | Status: DC
  Administered 2015-06-15: 1 via ORAL
  Filled 2015-06-15: qty 1

## 2015-06-15 MED ORDER — IBUPROFEN 600 MG PO TABS
600.0000 mg | ORAL_TABLET | Freq: Four times a day (QID) | ORAL | Status: DC
Start: 2015-06-15 — End: 2015-06-15
  Administered 2015-06-15 (×2): 600 mg via ORAL
  Filled 2015-06-15 (×2): qty 1

## 2015-06-15 MED ORDER — BENZOCAINE-MENTHOL 20-0.5 % EX AERO: 1.0000 "application " | INHALATION_SPRAY | CUTANEOUS | Status: DC | PRN

## 2015-06-15 MED ORDER — DIBUCAINE 1 % RE OINT: 1.0000 "application " | TOPICAL_OINTMENT | RECTAL | Status: DC | PRN

## 2015-06-15 MED ORDER — ACETAMINOPHEN 325 MG PO TABS: 650.0000 mg | ORAL_TABLET | ORAL | Status: DC | PRN

## 2015-06-15 MED ORDER — TETANUS-DIPHTH-ACELL PERTUSSIS 5-2.5-18.5 LF-MCG/0.5 IM SUSP
0.5000 mL | Freq: Once | INTRAMUSCULAR | Status: DC
Start: 2015-06-15 — End: 2015-06-15

## 2015-06-15 MED ORDER — ZOLPIDEM TARTRATE 5 MG PO TABS: 5.0000 mg | ORAL_TABLET | Freq: Every evening | ORAL | Status: DC | PRN

## 2015-06-15 MED ORDER — ONDANSETRON HCL 4 MG/2ML IJ SOLN: 4.0000 mg | INTRAMUSCULAR | Status: DC | PRN

## 2015-06-15 MED ORDER — WITCH HAZEL-GLYCERIN EX PADS: 1.0000 "application " | MEDICATED_PAD | CUTANEOUS | Status: DC | PRN

## 2015-06-15 MED ORDER — LANOLIN HYDROUS EX OINT: TOPICAL_OINTMENT | CUTANEOUS | Status: DC | PRN

## 2015-06-15 MED ORDER — SIMETHICONE 80 MG PO CHEW: 80.0000 mg | CHEWABLE_TABLET | ORAL | Status: DC | PRN

## 2015-06-15 NOTE — Discharge Summary (Signed)
    OB Discharge Summary     Patient Name: Maria Booker DOB: 1996/01/19 MRN: [id number]  Date of admission: 06/14/2015 Delivering MD:     Date of discharge: 06/15/2015  Admitting diagnosis: delivered at home Intrauterine pregnancy: 239w6d     Secondary diagnosis:  Active Problems:   Status post vaginal delivery   NSVD (normal spontaneous vaginal delivery)  Additional problems: none     Discharge diagnosis: Term Pregnancy Delivered                                                                                                Post partum procedures:none  Augmentation: n/a  Complications: None  Hospital course:  Postpartum care only  Physical exam  Filed Vitals:   06/14/15 2140 06/14/15 2200 06/14/15 2257 06/15/15 0236  BP: 112/49 118/63 114/66 105/53  Pulse: 107 121 82 106  Temp:    98 F (36.7 C)  TempSrc:    Oral  Resp: 18 18 18 18   SpO2: 100%   99%   General: alert, cooperative and no distress Lochia: appropriate Uterine Fundus: firm Incision: N/A DVT Evaluation: No evidence of DVT seen on physical exam. Labs: Lab Results  Component Value Date   WBC 11.0* 06/15/2015   HGB 8.7* 06/15/2015   HCT 26.7* 06/15/2015   MCV 81.9 06/15/2015   PLT 305 06/15/2015   No flowsheet data found.  Discharge instruction: per After Visit Summary and "Baby and Me Booklet".  After visit meds:    Medication List    ASK your doctor about these medications        acetaminophen 325 MG tablet  Commonly known as:  TYLENOL  Take 325 mg by mouth every 6 (six) hours as needed for mild pain or headache.     CLEAR EYES COMPLETE OP  Apply 1 drop to eye daily as needed (for eye irritation).     ferrous sulfate 325 (65 FE) MG tablet  Commonly known as:  FERROUSUL  Take 1 tablet (325 mg total) by mouth 2 (two) times daily with a meal.     prenatal multivitamin Tabs tablet  Take 1 tablet by mouth daily.        Diet: routine diet  Activity: Advance as tolerated. Pelvic rest  for 6 weeks.   Outpatient follow up:4 weeks Follow up Appt:No future appointments. Follow up Visit:No Follow-up on file.  Postpartum contraception: IUD Mirena  Newborn Data: Live born female  Birth Weight: 8 lb 3.9 oz (3740 g) APGAR: ,   Baby Feeding: Breast Disposition:home with mother   06/15/2015 Greig RightRESENZO-DISHMAN,Kingsten Enfield, CNM

## 2015-06-15 NOTE — Discharge Instructions (Signed)

## 2015-06-15 NOTE — Lactation Note (Signed)
This note was copied from a baby's chart. Lactation Consultation Note Experienced BF mom to her now 89 yr old for 2 1/2 yrs, half way through this pregnancy. Mom stated she had no trouble BF her 483 yr old. Mom stated it helps to have BF experience. Mom encouraged to feed baby 8-12 times/24 hours and with feeding cues. Referred to Baby and Me Book in Breastfeeding section Pg. 22-23 for position options and Proper latch demonstration.WH/LC brochure given w/resources, support groups and LC services. Patient Name: Maria Booker ZOXWR'UToday's Date: 06/15/2015 Reason for consult: Initial assessment   Maternal Data Has patient been taught Hand Expression?: Yes Does the patient have breastfeeding experience prior to this delivery?: Yes  Feeding Feeding Type: Breast Fed Length of feed: 60 min  LATCH Score/Interventions       Type of Nipple: Everted at rest and after stimulation  Comfort (Breast/Nipple): Soft / non-tender           Lactation Tools Discussed/Used     Consult Status Consult Status: Follow-up Date: 06/16/15 Follow-up type: In-patient    Charyl DancerCARVER, Emelee Rodocker G 06/15/2015, 6:28 AM

## 2015-06-15 NOTE — Progress Notes (Signed)
UR chart review completed.  

## 2015-06-15 NOTE — Clinical Social Work Maternal (Signed)
CLINICAL SOCIAL WORK MATERNAL/CHILD NOTE  Patient Details  Name: Maria Booker MRN: [id number] Date of Birth: Apr 13, 1995  Date:  06/15/2015  Clinical Social Worker Initiating Note:  Reinaldo Helt E. Brigitte Pulse, [place] Date/ Time Initiated:  06/15/15/1100     Child's Name:  Maria Booker   Legal Guardian:   (Parents: Lenny Pastel and Venetia Night.)   Need for Interpreter:  None   Date of Referral:  06/15/15     Reason for Referral:  Late or No Prenatal Care    Referral Source:  [facility]   Address:  [address] [place], [place] [address]  Phone number:  [phone]   Household Members:  Minor Children, Significant Other (Couple has a three year old son/[place].)   Natural Supports (not living in the home):  Immediate Family (MOB reports that FOB and PGM are her greatest support people.)   Professional Supports:     Employment:     Type of Work:  (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  MOB states she plans to enroll in school at [employer] for EMT training in the fall.)   Education:      Financial Resources:  Medicaid   Other Resources:      Cultural/Religious Considerations Which May Impact Care: None stated.  MOB's facesheet notes religion as Non-Denominational.  Strengths:  Ability to meet basic needs , Pediatrician chosen , Home prepared for child  (Pediatric follow up will be at [facility].)   Risk Factors/Current Problems:  None   Cognitive State:  Alert , Able to Concentrate , Linear Thinking , Insightful , Goal Oriented    Mood/Affect:  Calm , Comfortable , Relaxed , Euthymic , Interested    CSW Assessment: CSW met with MOB in her first floor room/139 to offer support and complete assessment due to [facility] and birth at home.  MOB was holding infant in bed and pleasantly welcomed CSW into her room by saying "of course you can come in."  She presents as calm and easily engaged.   MOB states she and baby are doing well.  Bonding is  evident in the way she looked at infant, tended to infant, and talked about infant.  MOB told CSW that she was a little concerned throughout her pregnancy that she was not bonding as much with this baby as she did her first, but that all those concerns have gone away as soon as she saw her daughter.  CSW asked to tell her birth story if she feels comfortable, as CSW is aware that she delivered at home.  MOB stated, "I feel like 50 people have asked me this."  CSW apologized, but MOB said she would tell her story.  She reports that labor started with contractions very infrequent and that she was not sure it was really time.  She states she came to the hospital with contractions at 36 weeks with her son and was turned away in false labor, so she was anticipating this was false also.  She states the contractions did not hurt until very close to the time she felt the need to push.  She called FOB at work and he said he could come home early with plans to come to the hospital.  She states he got home at 2pm and they realized that they did not think there was time to get to the hospital.  She states she delivered the baby at 4pm.  She told her story in a very relaxed manor and  told CSW that things went well.  She states she does not feel like she is in shock and reports that she thinks she would feel traumatized only if things had turned out badly.  She states thankfulness that things turned out well.   CSW inquired as to why she came to [place] when her address is [place].  She states she had two bad experiences at [facility], resulting in her never wishing to receive care there again.  She reports a great experience at [facility] hospital both this time and when she delivered her son three years ago.   MOB reports she and FOB are in a relationship and living together.  She reports that he is very involved and supportive.  She states his mother is currently caring for their son while they are in the hospital.  She  states she has all needed items prepared for baby at home.   CSW asked about MOB's [facility] and informed her of hospital drug screen policy.  MOB stated no concerns with baby being drug tested and stated understanding of the policy.  She reports one PNV to the [facility], but states she had to reschedule her first appointment and was told when she got there that if she couldn't be consistent, she shouldn't come back.  MOB stated frustration with this office and desire to get care in [place] throughout her pregnancy, but states issues with transportation prevented this.  She states she has now gotten her own car and that transportation is no longer and issue.  She reports FOB still has his own car as well.   CSW provided education regarding perinatal mood disorders and asked how MOB felt emotionally in the weeks and months following her first delivery.  She reports this as a positive time with no emotional concerns noted.  She was attentive to information CSW provided.   CSW notes that baby's UDS is negative and meconium drug screen is pending.  CSW will monitor results.  CSW identifies no current concerns or barriers to discharge when MOB and baby are medically ready.  CSW Plan/Description:  Patient/Family Education , No Further Intervention Required/No Barriers to Discharge    Kalman Shan 06/15/2015, 4:36 PM

## 2015-06-15 NOTE — Lactation Note (Signed)
This note was copied from a baby's chart. Lactation Consultation Note  P2, Ex BF for 2.5 years. Latched baby easily.  Sucks and swallow observed for 10 min. Mother denies problems or questions. Encouraged her to call if she needs assistance.  Mother has manual pump.  Patient Name: Maria Booker Reason for consult: Follow-up assessment   Maternal Data    Feeding Feeding Type: Breast Fed Length of feed: 10 min  LATCH Score/Interventions Latch: Grasps breast easily, tongue down, lips flanged, rhythmical sucking.  Audible Swallowing: A few with stimulation  Type of Nipple: Everted at rest and after stimulation  Comfort (Breast/Nipple): Soft / non-tender     Hold (Positioning): No assistance needed to correctly position infant at breast.  LATCH Score: 9  Lactation Tools Discussed/Used     Consult Status Consult Status: PRN    Hardie PulleyBerkelhammer, Kambrey Hagger Boschen Booker, 2:54 PM

## 2015-06-16 ENCOUNTER — Ambulatory Visit: Payer: Self-pay

## 2015-06-16 LAB — RPR: RPR: NONREACTIVE

## 2015-06-16 NOTE — Lactation Note (Signed)
This note was copied from a baby's chart. Lactation Consultation Note  Patient Name: Maria Delaine LameMorgan Baxley [id number]'XToday's Date: 06/16/2015 Reason for consult: Follow-up assessment;Other (Comment) (4% weight loss )  Baby is 4341 hours old and has been at the breast consistently. Per mom baby is breast feeding.  Breast are feeling warmer, heavier.  LC reviewed sore nipple and engorgement prevention and tx. Per mom was given comfort gels,  But soreness isn't bad.  Per mom has a DEBP at home.( 2nd baby breast feeding)  Mother informed of post-discharge support and given phone number to the lactation department, including services  for phone call assistance; out-patient appointments; and breastfeeding support group. List of other breastfeeding resources  in the community given in the handout. Encouraged mother to call for problems or concerns related to breastfeeding.   Maternal Data    Feeding Feeding Type: Breast Milk Length of feed: 10 min  LATCH Score/Interventions Latch:  (No latch scored- Mom never called )                    Lactation Tools Discussed/Used WIC Program: No   Consult Status Consult Status: Complete Date: 06/16/15    Kathrin Greathouseorio, Maria Booker 06/16/2015, 10:13 AM

## 2015-06-18 ENCOUNTER — Encounter (HOSPITAL_COMMUNITY): Payer: Self-pay | Admitting: *Deleted

## 2015-07-26 NOTE — Telephone Encounter (Signed)
According to the notes in the chart it looks like it.

## 2015-09-12 ENCOUNTER — Ambulatory Visit: Payer: Medicaid Other | Admitting: Obstetrics and Gynecology

## 2016-03-29 ENCOUNTER — Encounter (HOSPITAL_COMMUNITY): Payer: Self-pay | Admitting: *Deleted

## 2016-03-29 ENCOUNTER — Inpatient Hospital Stay (HOSPITAL_COMMUNITY)
Admission: AD | Admit: 2016-03-29 | Discharge: 2016-03-29 | Disposition: A | Discharge status: Home / Self Care | Payer: Medicaid Other | Source: Ambulatory Visit | Attending: Family Medicine | Admitting: Family Medicine

## 2016-03-29 ENCOUNTER — Inpatient Hospital Stay (HOSPITAL_COMMUNITY): Payer: Medicaid Other

## 2016-03-29 DIAGNOSIS — N939 Abnormal uterine and vaginal bleeding, unspecified: Secondary | ICD-10-CM | POA: Diagnosis present

## 2016-03-29 DIAGNOSIS — Z6790 Unspecified blood type, Rh positive: Secondary | ICD-10-CM

## 2016-03-29 DIAGNOSIS — O26899 Other specified pregnancy related conditions, unspecified trimester: Secondary | ICD-10-CM | POA: Diagnosis not present

## 2016-03-29 DIAGNOSIS — Z3490 Encounter for supervision of normal pregnancy, unspecified, unspecified trimester: Secondary | ICD-10-CM | POA: Diagnosis not present

## 2016-03-29 DIAGNOSIS — R109 Unspecified abdominal pain: Secondary | ICD-10-CM | POA: Diagnosis present

## 2016-03-29 DIAGNOSIS — O3680X0 Pregnancy with inconclusive fetal viability, not applicable or unspecified: Secondary | ICD-10-CM | POA: Diagnosis present

## 2016-03-29 DIAGNOSIS — O209 Hemorrhage in early pregnancy, unspecified: Secondary | ICD-10-CM | POA: Insufficient documentation

## 2016-03-29 DIAGNOSIS — O4690 Antepartum hemorrhage, unspecified, unspecified trimester: Secondary | ICD-10-CM | POA: Diagnosis not present

## 2016-03-29 DIAGNOSIS — Z87891 Personal history of nicotine dependence: Secondary | ICD-10-CM | POA: Insufficient documentation

## 2016-03-29 LAB — URINALYSIS, ROUTINE W REFLEX MICROSCOPIC
Bacteria, UA: NONE SEEN
Bilirubin Urine: NEGATIVE
GLUCOSE, UA: NEGATIVE mg/dL
KETONES UR: NEGATIVE mg/dL
LEUKOCYTES UA: NEGATIVE
Nitrite: NEGATIVE
PH: 5 (ref 5.0–8.0)
Protein, ur: NEGATIVE mg/dL
RBC / HPF: NONE SEEN RBC/hpf (ref 0–5)
SPECIFIC GRAVITY, URINE: 1.023 (ref 1.005–1.030)

## 2016-03-29 LAB — CBC
HEMATOCRIT: 33.5 % — AB (ref 36.0–46.0)
Hemoglobin: 11.8 g/dL — ABNORMAL LOW (ref 12.0–15.0)
MCH: 30.1 pg (ref 26.0–34.0)
MCHC: 35.2 g/dL (ref 30.0–36.0)
MCV: 85.5 fL (ref 78.0–100.0)
PLATELETS: 240 10*3/uL (ref 150–400)
RBC: 3.92 MIL/uL (ref 3.87–5.11)
RDW: 12.1 % (ref 11.5–15.5)
WBC: 3.6 10*3/uL — ABNORMAL LOW (ref 4.0–10.5)

## 2016-03-29 LAB — WET PREP, GENITAL
Sperm: NONE SEEN
Trich, Wet Prep: NONE SEEN
Yeast Wet Prep HPF POC: NONE SEEN

## 2016-03-29 LAB — POCT PREGNANCY, URINE: Preg Test, Ur: POSITIVE — AB

## 2016-03-29 LAB — HCG, QUANTITATIVE, PREGNANCY: hCG, Beta Chain, Quant, S: 30 m[IU]/mL — ABNORMAL HIGH (ref <5)

## 2016-03-29 NOTE — Discharge Instructions (Signed)
Threatened Miscarriage °A threatened miscarriage is when you have vaginal bleeding during your first 20 weeks of pregnancy but the pregnancy has not ended. Your doctor will do tests to make sure you are still pregnant. The cause of the bleeding may not be known. This condition does not mean your pregnancy will end. It does increase the risk of it ending (complete miscarriage). °Follow these instructions at home: °· Make sure you keep all your doctor visits for prenatal care. °· Get plenty of rest. °· Do not have sex or use tampons if you have vaginal bleeding. °· Do not douche. °· Do not smoke or use drugs. °· Do not drink alcohol. °· Avoid caffeine. °Contact a doctor if: °· You have light bleeding from your vagina. °· You have belly pain or cramping. °· You have a fever. °Get help right away if: °· You have heavy bleeding from your vagina. °· You have clots of blood coming from your vagina. °· You have bad pain or cramps in your low back or belly. °· You have fever, chills, and bad belly pain. °This information is not intended to replace advice given to you by your health care provider. Make sure you discuss any questions you have with your health care provider. °Document Released: 02/14/2008 Document Revised: 08/09/2015 Document Reviewed: 12/28/2012 °Elsevier Interactive Patient Education © 2017 [employer] ° °

## 2016-03-29 NOTE — MAU Note (Signed)
Pt states that she was cramping last night and woke up today still cramping.  Pt states she bent over to pick up a child from a crib and started bleeding down her leg.  Pt states after she started bleeding she put in a Diva cup and it was a quarter of the way full when she gave her urine sample and she put it in at 0800 this morning.

## 2016-03-29 NOTE — MAU Provider Note (Signed)
History     CSN: [id number]  Arrival date and time: 03/29/16 4098   First Provider Initiated Contact with Patient 03/29/16 1025      Chief Complaint  Patient presents with  . Vaginal Bleeding  . Abdominal Pain   [id number] at unknown gestation here with cramping and VB. She reports having one strong cramp around 0800 then bleeding down legs started. She inserted a diva cup and that was 1/4 full after 2 hours. She is unsure of LMP but knows it was early December. She had +HPT about 4 days ago. She reports IC last night. She denies vaginal discharge, itching, or odor.  She rates pain now 3/10. She didn't use anything for the cramping.    OB History    Gravida Para Term Preterm AB Living   4 2 2   1 2    SAB TAB Ectopic Multiple Live Births   1     0 2      Past Medical History:  Diagnosis Date  . Asthma   . Pyelonephritis   . RMSF [facility] spotted fever)    hospitalization    Past Surgical History:  Procedure Laterality Date  . TYMPANOSTOMY TUBE PLACEMENT      Family History  Problem Relation Age of Onset  . Cancer Mother   . Hepatitis B Mother   . Diabetes Mother   . Cancer Maternal Grandmother     Social History  Substance Use Topics  . Smoking status: Former Games developer  . Smokeless tobacco: Former Neurosurgeon    Quit date: 03/30/2012  . Alcohol use No    Allergies:  Allergies  Allergen Reactions  . Bee Venom Swelling and Other (See Comments)    Reaction:  Localized swelling  . Fexofenadine Hcl Other (See Comments)    Reaction:  Migraines   . Latex Rash    Prescriptions Prior to Admission  Medication Sig Dispense Refill Last Dose  . Pediatric Multivit-Minerals-C (FLINTSTONES GUMMIES COMPLETE PO) Take 2 each by mouth daily.   03/28/2016 at Unknown time    Review of Systems  Gastrointestinal: Positive for abdominal pain.  Genitourinary: Positive for vaginal bleeding. Negative for vaginal discharge.   Physical Exam   Blood pressure 113/71, pulse 90,  temperature 98.3 F (36.8 C), temperature source Oral, resp. rate 16, height 5\' 2"  (1.575 m), weight 59.1 kg (130 lb 3.2 oz), last menstrual period 02/15/2016, SpO2 100 %, unknown if currently breastfeeding.  Physical Exam  Nursing note and vitals reviewed. Constitutional: She is oriented to person, place, and time. She appears well-developed and well-nourished. No distress.  HENT:  Head: Normocephalic.  Neck: Normal range of motion.  Cardiovascular: Normal rate.   Respiratory: Effort normal.  GI: Soft. She exhibits no distension and no mass. There is no tenderness. There is no rebound and no guarding.  Genitourinary:  Genitourinary Comments: External: nop lesions or erythema Vagina: rugated, parous, mod bloody discharge, no active bleeding from os Uterus: non enlarged, anteverted, moderately tender, no CMT Adnexae: no masses, no tenderness left, no tenderness right   Musculoskeletal: Normal range of motion.  Neurological: She is alert and oriented to person, place, and time.  Skin: Skin is warm and dry.  Psychiatric: She has a normal mood and affect.   Results for orders placed or performed during the hospital encounter of 03/29/16 (from the past 24 hour(s))  Pregnancy, urine POC     Status: Abnormal   Collection Time: 03/29/16  9:56 AM  Result Value Ref  Range   Preg Test, Ur POSITIVE (A) NEGATIVE  Urinalysis, Routine w reflex microscopic     Status: Abnormal   Collection Time: 03/29/16  9:57 AM  Result Value Ref Range   Color, Urine YELLOW YELLOW   APPearance CLEAR CLEAR   Specific Gravity, Urine 1.023 1.005 - 1.030   pH 5.0 5.0 - 8.0   Glucose, UA NEGATIVE NEGATIVE mg/dL   Hgb urine dipstick LARGE (A) NEGATIVE   Bilirubin Urine NEGATIVE NEGATIVE   Ketones, ur NEGATIVE NEGATIVE mg/dL   Protein, ur NEGATIVE NEGATIVE mg/dL   Nitrite NEGATIVE NEGATIVE   Leukocytes, UA NEGATIVE NEGATIVE   RBC / HPF NONE SEEN 0 - 5 RBC/hpf   WBC, UA 0-5 0 - 5 WBC/hpf   Bacteria, UA NONE SEEN  NONE SEEN   Squamous Epithelial / LPF 0-5 (A) NONE SEEN   Mucous PRESENT   Wet prep, genital     Status: Abnormal   Collection Time: 03/29/16 10:38 AM  Result Value Ref Range   Yeast Wet Prep HPF POC NONE SEEN NONE SEEN   Trich, Wet Prep NONE SEEN NONE SEEN   Clue Cells Wet Prep HPF POC PRESENT (A) NONE SEEN   WBC, Wet Prep HPF POC FEW (A) NONE SEEN   Sperm NONE SEEN   CBC     Status: Abnormal   Collection Time: 03/29/16 10:45 AM  Result Value Ref Range   WBC 3.6 (L) 4.0 - 10.5 K/uL   RBC 3.92 3.87 - 5.11 MIL/uL   Hemoglobin 11.8 (L) 12.0 - 15.0 g/dL   HCT 16.133.5 (L) 09.636.0 - 04.546.0 %   MCV 85.5 78.0 - 100.0 fL   MCH 30.1 26.0 - 34.0 pg   MCHC 35.2 30.0 - 36.0 g/dL   RDW 40.912.1 81.111.5 - 91.415.5 %   Platelets 240 150 - 400 K/uL  hCG, quantitative, pregnancy     Status: Abnormal   Collection Time: 03/29/16 10:45 AM  Result Value Ref Range   hCG, Beta Chain, Quant, S 30 (H) <5 mIU/mL   [place] Ob Comp Less 14 Wks  Result Date: 03/29/2016 CLINICAL DATA:  Vaginal bleeding since this morning. Patient is reportedly pregnant. EXAM: OBSTETRIC <14 WK US AND TRANSVAGINAL OB US TECHNIQUE: Both transabdominal and transvaginal ultrasound examinations were performed for complete evaluation of the gestation as well as the maternal uterus, adnexal regions, and pelvic cul-de-sac. Transvaginal technique was performed to assess early pregnancy. COMPARISON:  None. FINDINGS: Intrauterine gestational sac: None Yolk sac:  Not Visualized. Embryo:  Not Visualized. Subchorionic hemorrhage:  None visualized. Maternal uterus/adnexae: No uterine masses. Cervix is unremarkable. Normal ovaries and adnexa. Small amount pelvic free fluid. IMPRESSION: 1. No evidence of an intrauterine or ectopic pregnancy. 2. Small amount pelvic free fluid.  Otherwise unremarkable. Electronically Signed   By: Amie Portlandavid  Ormond M.D.   On: 03/29/2016 11:37   [place] Ob Transvaginal  Result Date: 03/29/2016 CLINICAL DATA:  Vaginal bleeding since this morning.  Patient is reportedly pregnant. EXAM: OBSTETRIC <14 WK US AND TRANSVAGINAL OB US TECHNIQUE: Both transabdominal and transvaginal ultrasound examinations were performed for complete evaluation of the gestation as well as the maternal uterus, adnexal regions, and pelvic cul-de-sac. Transvaginal technique was performed to assess early pregnancy. COMPARISON:  None. FINDINGS: Intrauterine gestational sac: None Yolk sac:  Not Visualized. Embryo:  Not Visualized. Subchorionic hemorrhage:  None visualized. Maternal uterus/adnexae: No uterine masses. Cervix is unremarkable. Normal ovaries and adnexa. Small amount pelvic free fluid. IMPRESSION: 1. No evidence of an intrauterine  or ectopic pregnancy. 2. Small amount pelvic free fluid.  Otherwise unremarkable. Electronically Signed   By: Amie Portland M.D.   On: 03/29/2016 11:37   MAU Course  Procedures  MDM Labs and [place] ordered and reviewed. No evidence of IUP on [place], quant low, likely recent SAB but cannot r/o ectopic or early pregnancy today. Bleeding stable. Pain minimal. Stable for discharge home.  Assessment and Plan   1. Pregnancy of unknown anatomic location   2. Vaginal bleeding in pregnancy   3. Rh(D) positive   4. Abdominal cramping affecting pregnancy    Discharge home Follow up in [facility] in 2 days for rpt quant Ectopic/bleeding precautions Tylenol/Ibuprofen prn  Allergies as of 03/29/2016      Reactions   Bee Venom Swelling, Other (See Comments)   Reaction:  Localized swelling   Fexofenadine Hcl Other (See Comments)   Reaction:  Migraines    Latex Rash      Medication List    TAKE these medications   FLINTSTONES GUMMIES COMPLETE PO Take 2 each by mouth daily.      Donette Larry, CNM 03/29/2016, 10:30 AM

## 2016-03-31 ENCOUNTER — Ambulatory Visit: Payer: Self-pay

## 2016-03-31 LAB — GC/CHLAMYDIA PROBE AMP (~~LOC~~) NOT AT ARMC
Chlamydia: NEGATIVE
NEISSERIA GONORRHEA: NEGATIVE

## 2016-04-01 ENCOUNTER — Ambulatory Visit: Payer: Self-pay | Admitting: General Practice

## 2016-04-01 DIAGNOSIS — O3680X0 Pregnancy with inconclusive fetal viability, not applicable or unspecified: Secondary | ICD-10-CM

## 2016-04-01 LAB — HCG, QUANTITATIVE, PREGNANCY: hCG, Beta Chain, Quant, S: 3 m[IU]/mL (ref <5)

## 2016-04-01 NOTE — Progress Notes (Signed)
Patient here for stat bhcg today. Reports heavy bleeding with clots yesterday and severe cramps. Patient states bleeding has slowed down to a regular period now. Patient states she cannot wait in the lobby the whole time as she has children in the car with her. Per Dorathy KinsmanVirginia Smith, patient should remain in the area and return to the hospital around 12pm for results. Patient informed. Per [place], patient's levels are non pregnant and require no further follow up. Patient informed. Patient verbalized understanding & had no questions

## 2017-02-13 ENCOUNTER — Encounter (HOSPITAL_COMMUNITY): Payer: Self-pay

## 2017-04-30 IMAGING — US US OB COMP LESS 14 WK
1 series · 14 of 28 positions shown · non-contrast
Comparison: None.

CLINICAL DATA: Pregnant patient with pelvic pain and bilateral
adnexal tenderness. Cramping for 1 day.

EXAM:
OBSTETRIC <14 WK US AND TRANSVAGINAL OB US
TECHNIQUE: Both transabdominal and transvaginal ultrasound examinations were
performed for complete evaluation of the gestation as well as the
maternal uterus, adnexal regions, and pelvic cul-de-sac.
Transvaginal technique was performed to assess early pregnancy.

[Series 1: us ob follow up · 14 of 60 slices shown]
[im 3/60]
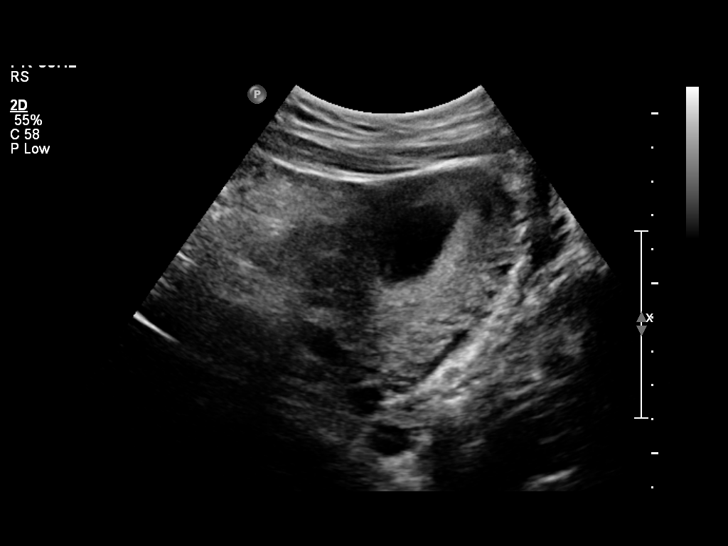
[im 7/60]
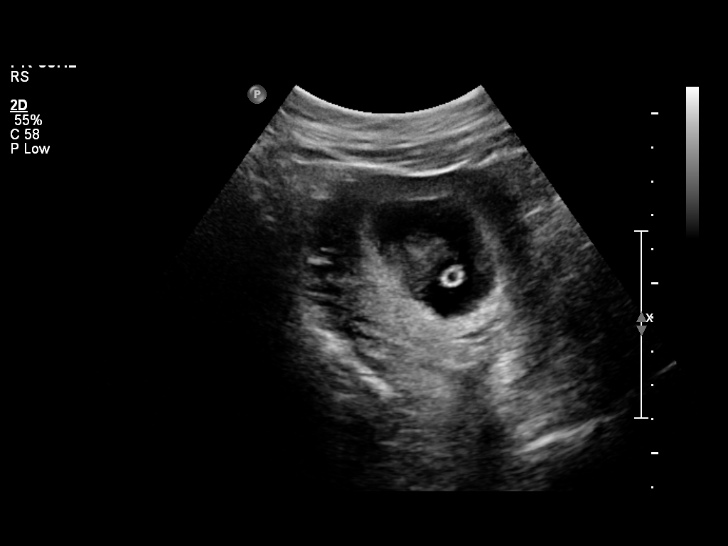
[im 11/60]
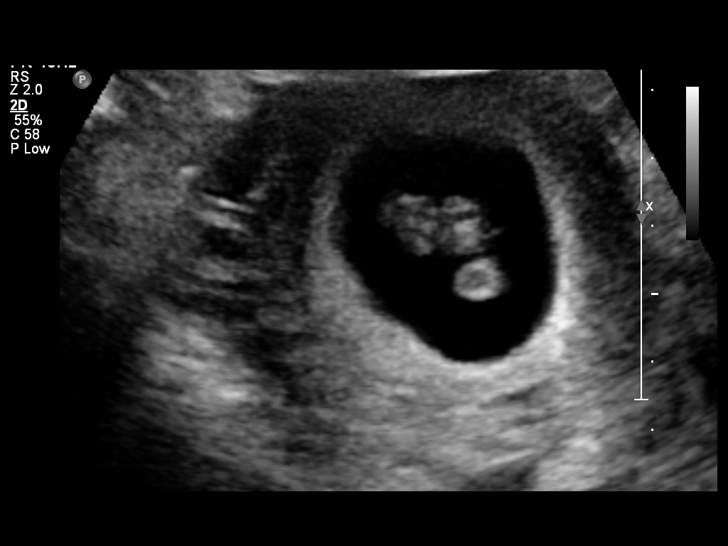
[im 16/60]
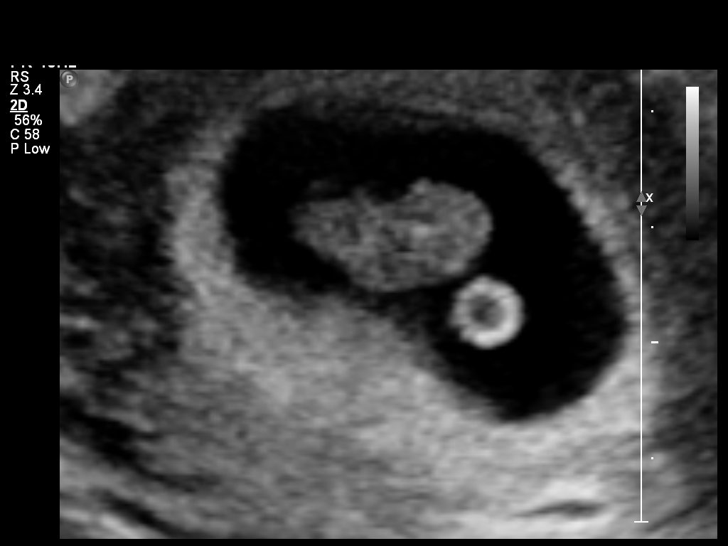
[im 20/60]
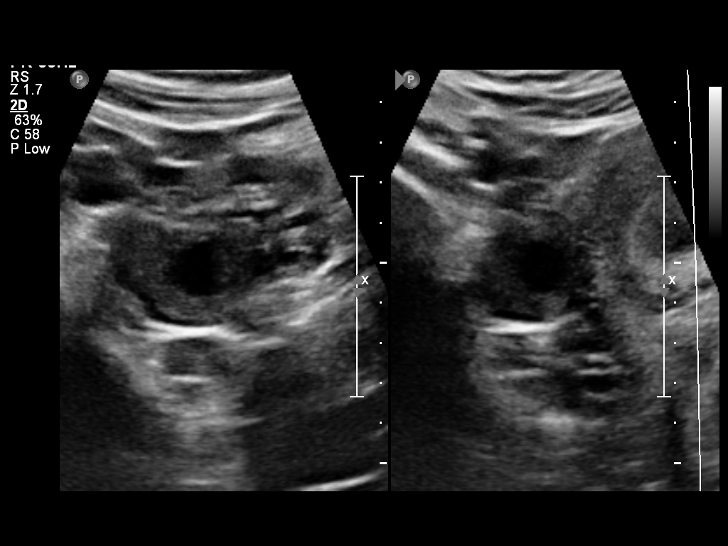
[im 25/60]
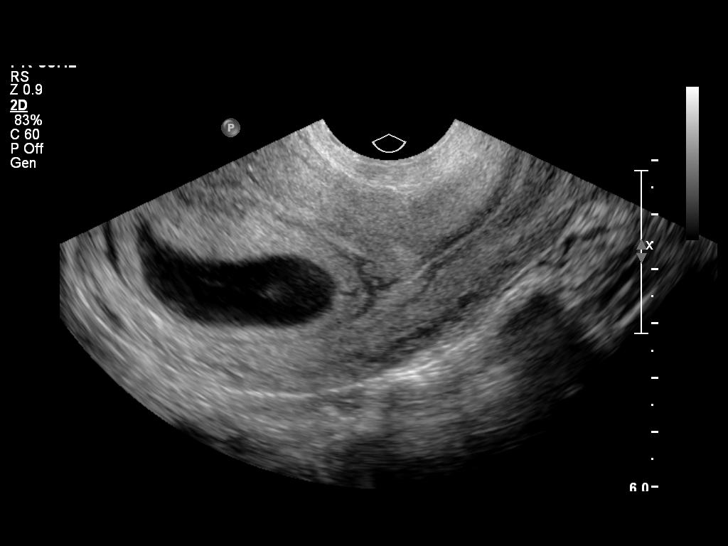
[im 29/60]
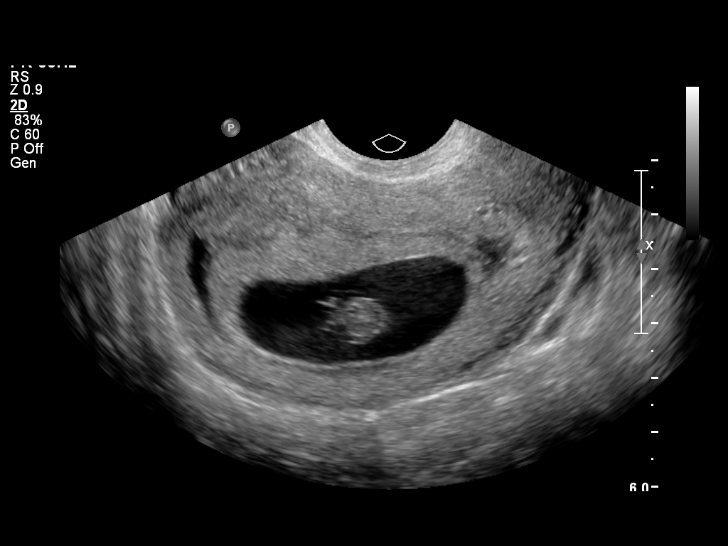
[im 33/60]
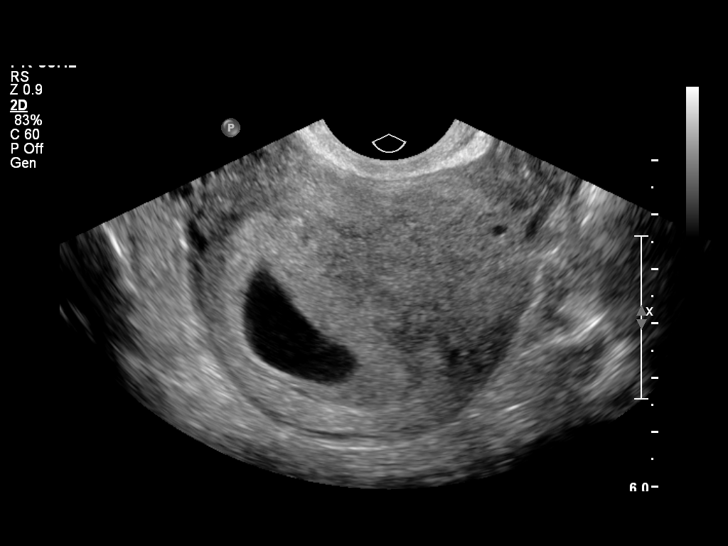
[im 38/60]
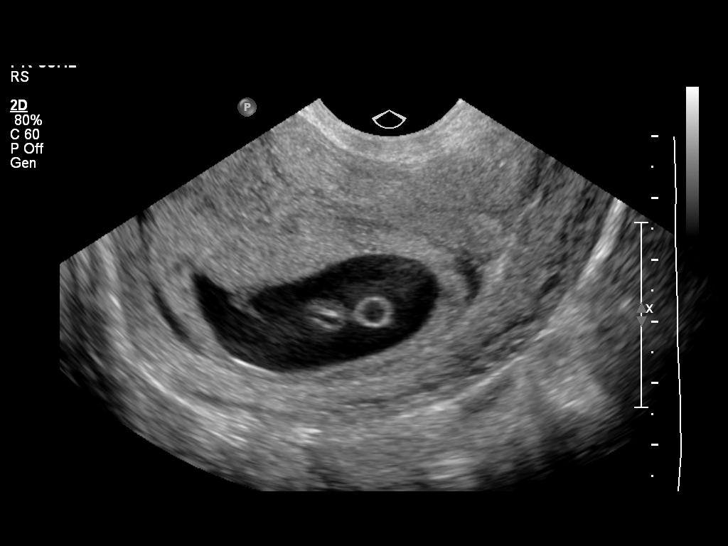
[im 42/60]
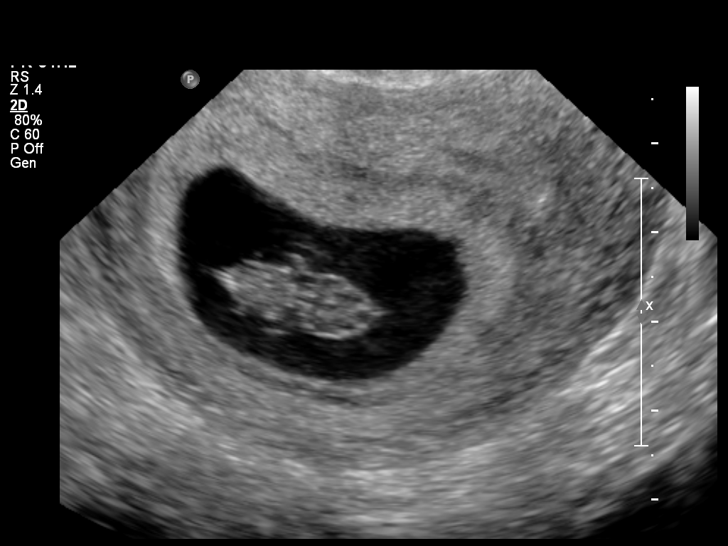
[im 46/60]
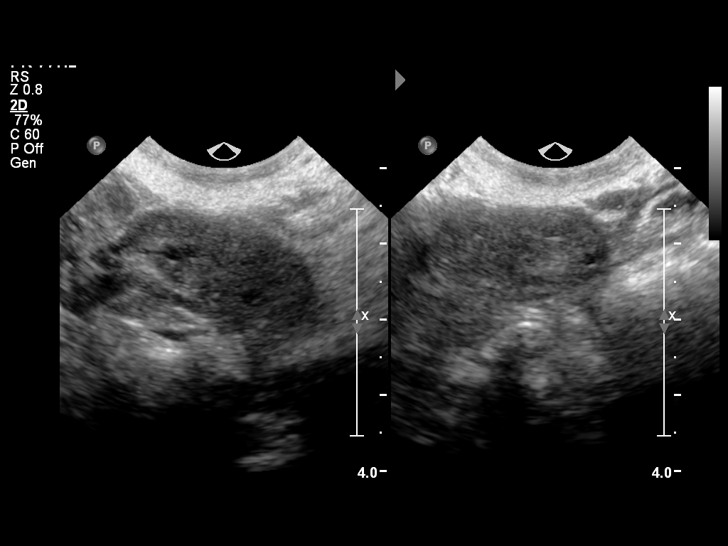
[im 51/60]
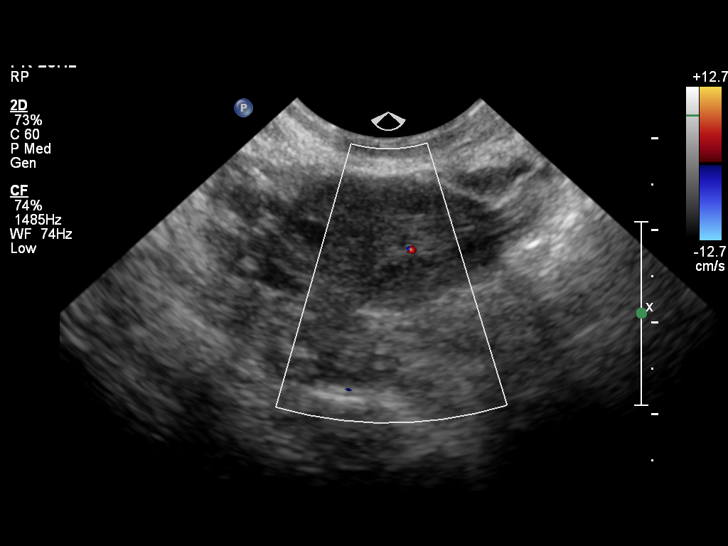
[im 55/60]
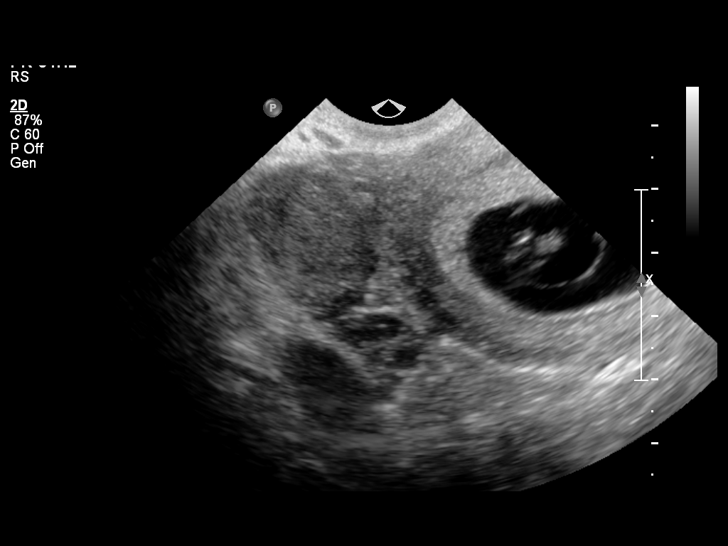
[im 60/60]
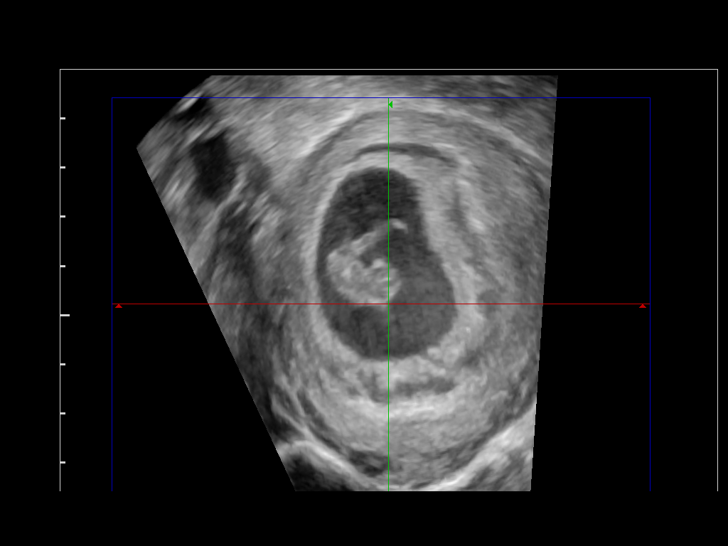

[14 of 28 positions shown; findings below may reference images not displayed]

FINDINGS: Intrauterine gestational sac: Visualized/normal in shape.

Yolk sac:  Present.

Embryo:  Present.

Cardiac Activity: Present.

Heart Rate: 158  bpm

CRL:  18  mm   8 w   2 d                  US EDC: 06/16/2015

Maternal uterus/adnexae: Small subchorionic hemorrhage fundal and
questionable lower. The left ovary measures 2.7 x 1.3 x 2.3 cm and
is normal. The right ovary measures 3.7 x 2.6 x 2.6 cm with a
probable corpus luteal cyst. There is blood flow to both ovaries.
There is no pelvic free fluid.
IMPRESSION: Single live intrauterine pregnancy estimated gestational age 8 weeks
2 days for estimated date of delivery 06/16/2015. Small subchorionic
hemorrhage.

## 2018-05-28 ENCOUNTER — Telehealth: Payer: Self-pay | Admitting: Family Medicine

## 2018-05-28 NOTE — Telephone Encounter (Signed)
Called patient and left a detailed message about appt change, Patient appt was rsch due to see need to see a Provider that can do surgery

## 2018-06-01 ENCOUNTER — Ambulatory Visit: Payer: Self-pay | Admitting: Family Medicine

## 2018-06-02 ENCOUNTER — Ambulatory Visit: Payer: Self-pay | Admitting: Obstetrics & Gynecology

## 2018-09-21 IMAGING — US US OB TRANSVAGINAL
1 series · 15 of 28 positions shown · non-contrast
Comparison: None.

CLINICAL DATA: Vaginal bleeding since this morning. Patient is
reportedly pregnant.

EXAM:
OBSTETRIC <14 WK US AND TRANSVAGINAL OB US
TECHNIQUE: Both transabdominal and transvaginal ultrasound examinations were
performed for complete evaluation of the gestation as well as the
maternal uterus, adnexal regions, and pelvic cul-de-sac.
Transvaginal technique was performed to assess early pregnancy.

[Series 1: us ob transvaginal · 15 of 50 slices shown]
[im 1/50]
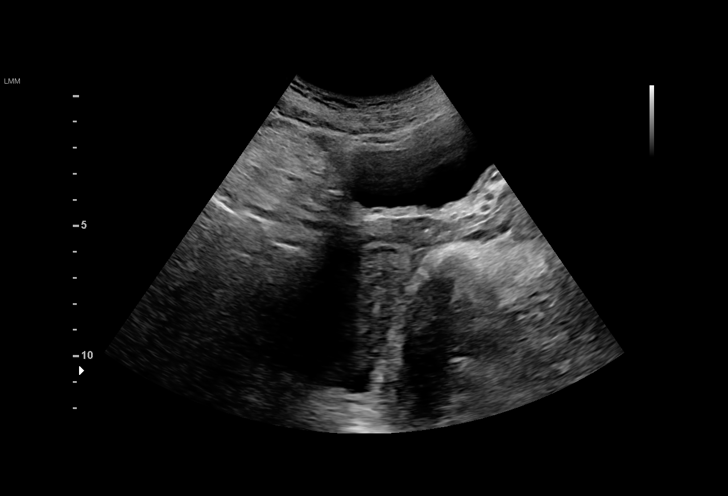
[im 4/50]
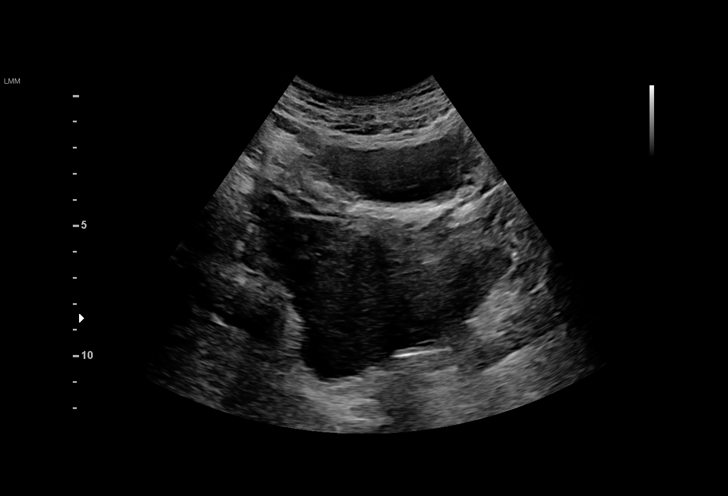
[im 8/50]
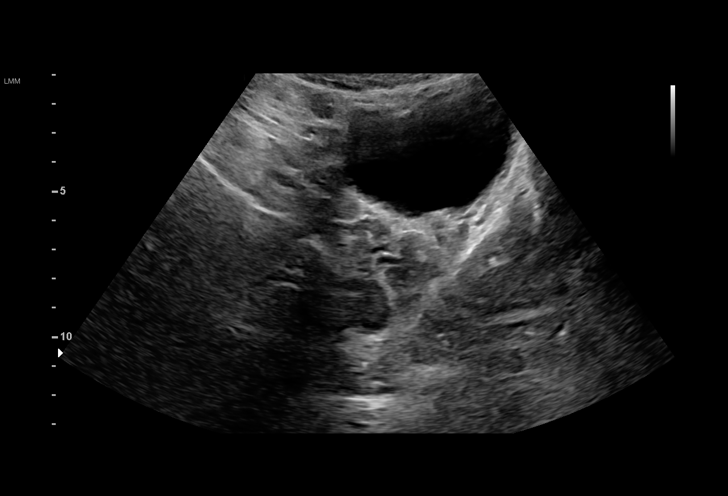
[im 11/50]
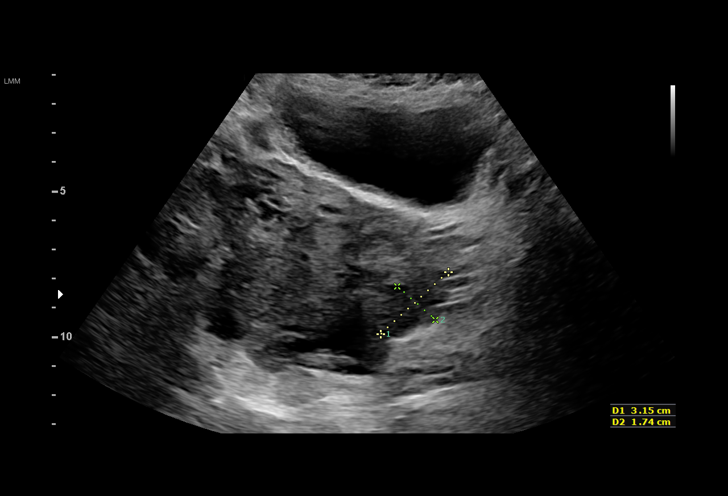
[im 15/50]
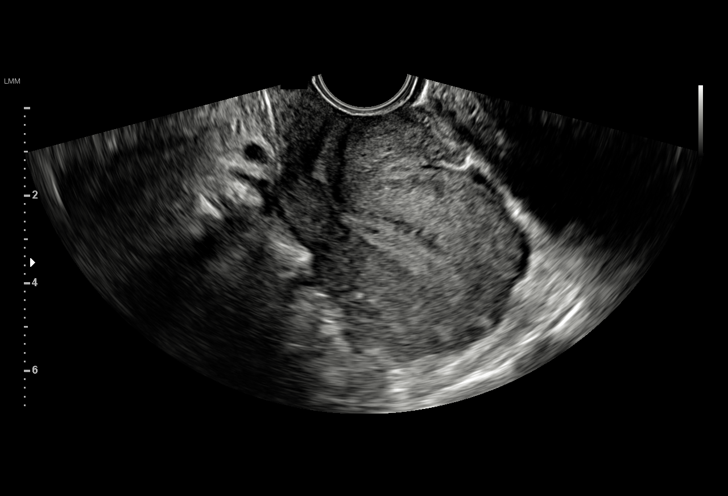
[im 19/50]
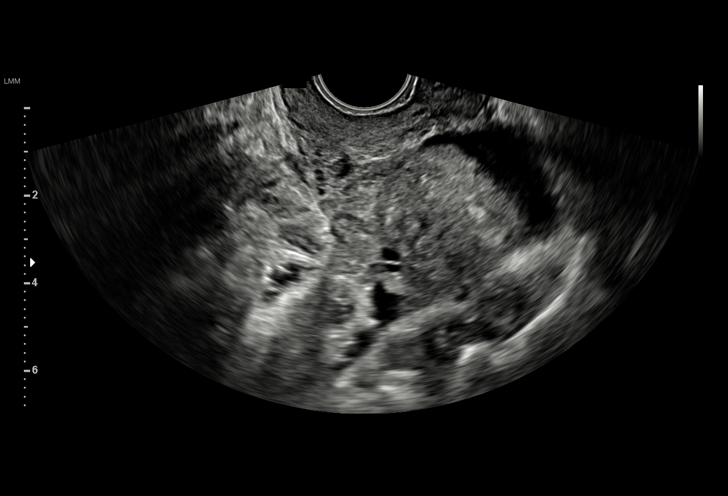
[im 22/50]
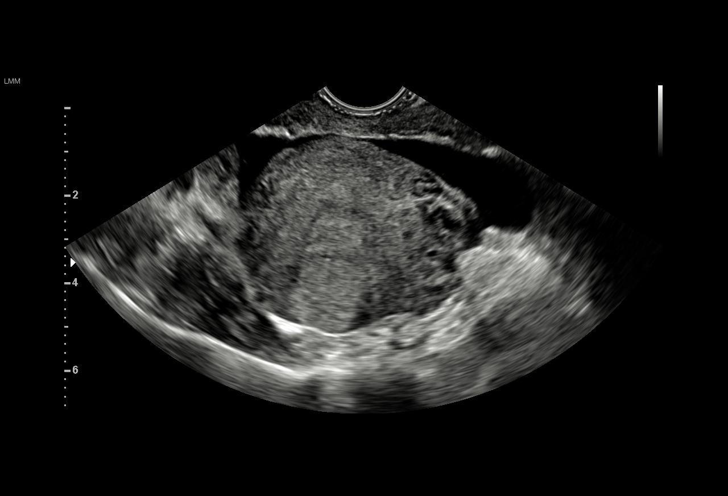
[im 26/50]
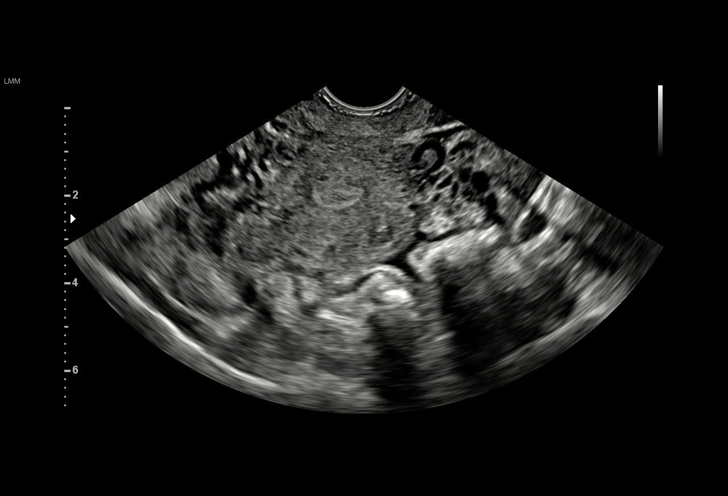
[im 28/50]
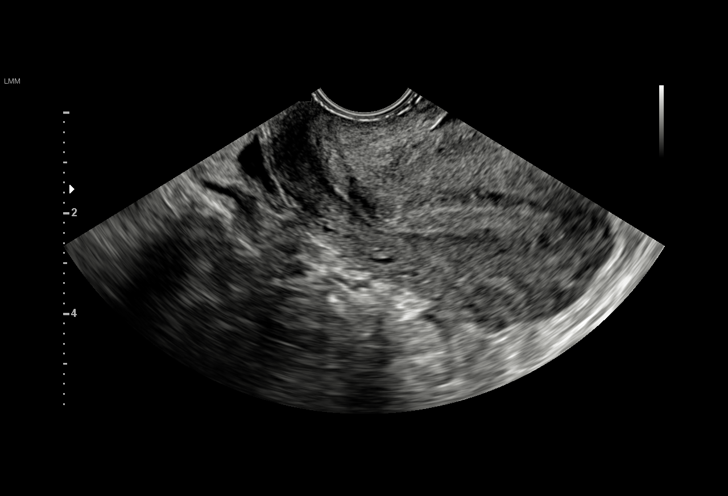
[im 31/50]
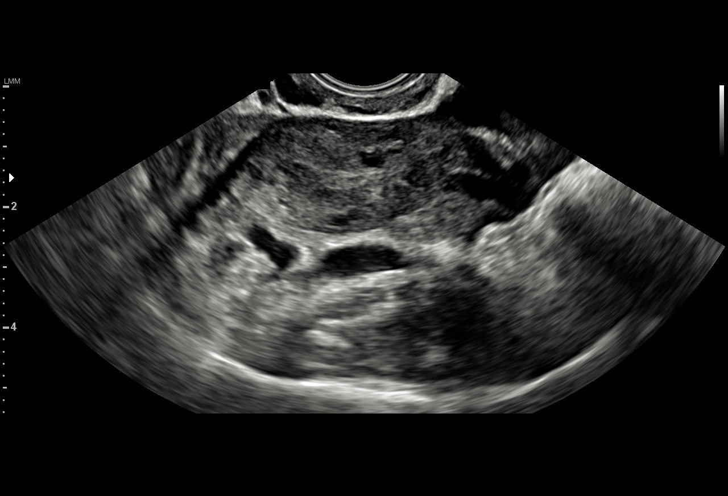
[im 35/50]
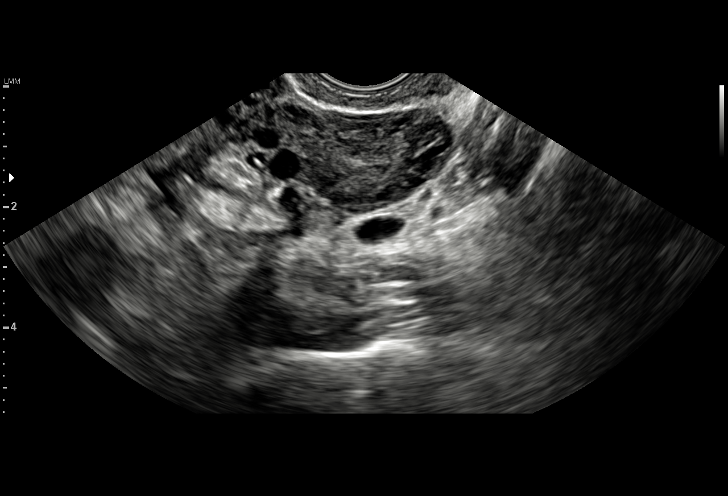
[im 39/50]
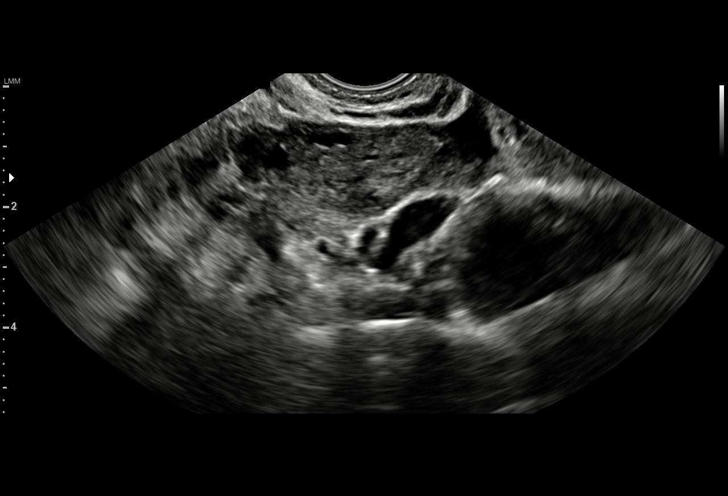
[im 42/50]
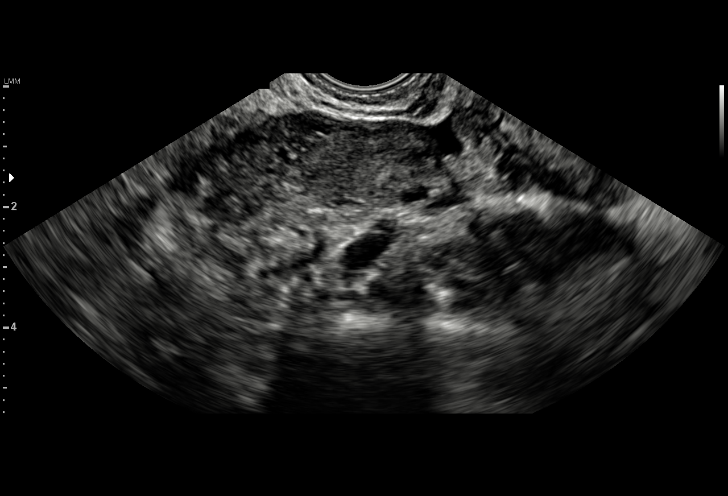
[im 46/50]
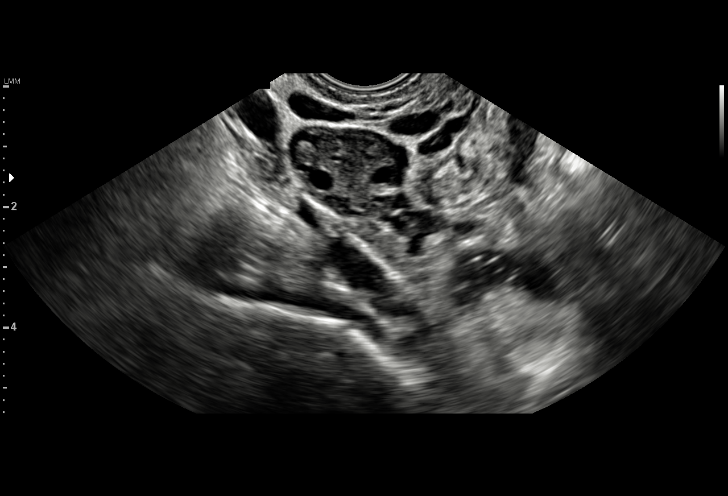
[im 50/50]
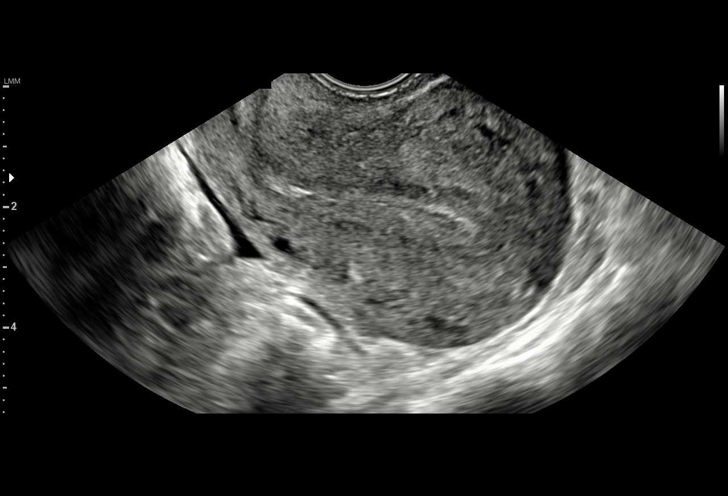

[15 of 28 positions shown; findings below may reference images not displayed]

FINDINGS: Intrauterine gestational sac: None

Yolk sac:  Not Visualized.

Embryo:  Not Visualized.

Subchorionic hemorrhage:  None visualized.

Maternal uterus/adnexae: No uterine masses. Cervix is unremarkable.
Normal ovaries and adnexa. Small amount pelvic free fluid.
IMPRESSION: 1. No evidence of an intrauterine or ectopic pregnancy.
2. Small amount pelvic free fluid.  Otherwise unremarkable.
